# Patient Record
Sex: Female | Born: 1960 | Race: White | Hispanic: No | Marital: Married | Smoking: Former smoker
Health system: Southern US, Community
[De-identification: ages and names within clinical notes are randomized; demographics above are authoritative.]

## PROBLEM LIST (undated history)

## (undated) DIAGNOSIS — E119 Type 2 diabetes mellitus without complications: Secondary | ICD-10-CM

## (undated) DIAGNOSIS — M1990 Unspecified osteoarthritis, unspecified site: Secondary | ICD-10-CM

## (undated) DIAGNOSIS — K2970 Gastritis, unspecified, without bleeding: Secondary | ICD-10-CM

## (undated) DIAGNOSIS — E039 Hypothyroidism, unspecified: Secondary | ICD-10-CM

## (undated) DIAGNOSIS — R232 Flushing: Secondary | ICD-10-CM

## (undated) DIAGNOSIS — F32A Depression, unspecified: Secondary | ICD-10-CM

## (undated) DIAGNOSIS — K449 Diaphragmatic hernia without obstruction or gangrene: Secondary | ICD-10-CM

## (undated) DIAGNOSIS — N281 Cyst of kidney, acquired: Secondary | ICD-10-CM

## (undated) DIAGNOSIS — F329 Major depressive disorder, single episode, unspecified: Secondary | ICD-10-CM

## (undated) DIAGNOSIS — M8580 Other specified disorders of bone density and structure, unspecified site: Secondary | ICD-10-CM

## (undated) DIAGNOSIS — N6019 Diffuse cystic mastopathy of unspecified breast: Secondary | ICD-10-CM

## (undated) DIAGNOSIS — K219 Gastro-esophageal reflux disease without esophagitis: Secondary | ICD-10-CM

## (undated) DIAGNOSIS — Z8619 Personal history of other infectious and parasitic diseases: Secondary | ICD-10-CM

## (undated) HISTORY — PX: TUBAL LIGATION: SHX77

## (undated) HISTORY — DX: Gastritis, unspecified, without bleeding: K29.70

## (undated) HISTORY — DX: Type 2 diabetes mellitus without complications: E11.9

## (undated) HISTORY — DX: Personal history of other infectious and parasitic diseases: Z86.19

## (undated) HISTORY — DX: Diaphragmatic hernia without obstruction or gangrene: K44.9

## (undated) HISTORY — DX: Depression, unspecified: F32.A

## (undated) HISTORY — PX: KIDNEY CYST REMOVAL: SHX684

## (undated) HISTORY — DX: Diffuse cystic mastopathy of unspecified breast: N60.19

## (undated) HISTORY — PX: WISDOM TOOTH EXTRACTION: SHX21

## (undated) HISTORY — DX: Other specified disorders of bone density and structure, unspecified site: M85.80

## (undated) HISTORY — PX: KNEE ARTHROSCOPY: SUR90

## (undated) HISTORY — DX: Unspecified osteoarthritis, unspecified site: M19.90

## (undated) HISTORY — DX: Major depressive disorder, single episode, unspecified: F32.9

---

## 2004-03-27 ENCOUNTER — Ambulatory Visit: Payer: Self-pay | Admitting: Family Medicine

## 2004-08-04 ENCOUNTER — Encounter: Payer: Self-pay | Admitting: Orthopaedic Surgery

## 2004-08-30 ENCOUNTER — Encounter: Payer: Self-pay | Admitting: Orthopaedic Surgery

## 2004-12-05 ENCOUNTER — Ambulatory Visit: Payer: Self-pay | Admitting: Urology

## 2004-12-25 ENCOUNTER — Ambulatory Visit: Payer: Self-pay | Admitting: Urology

## 2005-02-23 ENCOUNTER — Ambulatory Visit: Payer: Self-pay | Admitting: Urology

## 2005-05-05 ENCOUNTER — Ambulatory Visit: Payer: Self-pay | Admitting: Family Medicine

## 2005-05-29 ENCOUNTER — Ambulatory Visit: Payer: Self-pay | Admitting: Urology

## 2006-02-22 ENCOUNTER — Ambulatory Visit: Payer: Self-pay | Admitting: Family Medicine

## 2006-03-30 ENCOUNTER — Ambulatory Visit: Payer: Self-pay | Admitting: Family Medicine

## 2006-05-21 ENCOUNTER — Ambulatory Visit: Payer: Self-pay | Admitting: Urology

## 2007-03-03 ENCOUNTER — Ambulatory Visit: Payer: Self-pay

## 2008-03-28 ENCOUNTER — Ambulatory Visit: Payer: Self-pay | Admitting: Family Medicine

## 2008-07-09 ENCOUNTER — Ambulatory Visit: Payer: Self-pay | Admitting: Unknown Physician Specialty

## 2009-03-29 ENCOUNTER — Ambulatory Visit: Payer: Self-pay

## 2010-04-15 ENCOUNTER — Ambulatory Visit: Payer: Self-pay

## 2010-05-22 ENCOUNTER — Other Ambulatory Visit: Payer: Self-pay | Admitting: Family Medicine

## 2010-05-28 ENCOUNTER — Ambulatory Visit: Payer: Self-pay

## 2010-06-20 ENCOUNTER — Ambulatory Visit: Payer: Self-pay | Admitting: General Practice

## 2011-03-13 ENCOUNTER — Ambulatory Visit: Payer: Self-pay | Admitting: General Practice

## 2011-04-29 ENCOUNTER — Ambulatory Visit: Payer: Self-pay | Admitting: Family Medicine

## 2011-08-25 ENCOUNTER — Ambulatory Visit: Payer: Self-pay | Admitting: Family Medicine

## 2012-06-27 ENCOUNTER — Ambulatory Visit: Payer: Self-pay | Admitting: General Practice

## 2012-11-09 ENCOUNTER — Ambulatory Visit: Payer: Self-pay | Admitting: Family Medicine

## 2013-06-29 ENCOUNTER — Ambulatory Visit: Payer: Self-pay | Admitting: Orthopedic Surgery

## 2013-07-21 NOTE — Progress Notes (Signed)
Please put orders in Epic surgery 08-01-13 pre op visit 07-28-13 Thank you!

## 2013-07-24 NOTE — Progress Notes (Signed)
Need orders in EPIC.  Surgery scheduled for 08/01/13.  Preop on 07/28/13 at 130pm. Thank You.

## 2013-07-25 ENCOUNTER — Other Ambulatory Visit: Payer: Self-pay | Admitting: Orthopedic Surgery

## 2013-07-26 ENCOUNTER — Encounter (HOSPITAL_COMMUNITY): Payer: Self-pay | Admitting: Pharmacy Technician

## 2013-07-27 ENCOUNTER — Encounter (HOSPITAL_COMMUNITY): Payer: Self-pay | Admitting: Pharmacy Technician

## 2013-07-28 ENCOUNTER — Encounter (HOSPITAL_COMMUNITY)
Admission: RE | Admit: 2013-07-28 | Discharge: 2013-07-28 | Disposition: A | Discharge status: Home / Self Care | Payer: 59 | Source: Ambulatory Visit | Attending: Orthopedic Surgery | Admitting: Orthopedic Surgery

## 2013-07-28 ENCOUNTER — Encounter (HOSPITAL_COMMUNITY): Payer: Self-pay

## 2013-07-28 DIAGNOSIS — Z01812 Encounter for preprocedural laboratory examination: Secondary | ICD-10-CM | POA: Insufficient documentation

## 2013-07-28 HISTORY — DX: Gastro-esophageal reflux disease without esophagitis: K21.9

## 2013-07-28 HISTORY — DX: Cyst of kidney, acquired: N28.1

## 2013-07-28 HISTORY — DX: Hypothyroidism, unspecified: E03.9

## 2013-07-28 HISTORY — DX: Flushing: R23.2

## 2013-07-28 NOTE — Patient Instructions (Addendum)
      Your procedure is scheduled on:  08/01/13  TUESDAY  Report to [facility] at  1:00  PM  Call this number if you have problems the morning of surgery: [phone]        Do not eat food After Midnight.MONDAY NIGHT-- MAY HAVE CLEAR LIQUIDS Tuesday MORNING UNTIL 09:30 AM,  THEN NOTHING BY MOUTH   Take these medicines the morning of surgery with A SIP OF WATER: Levothyroxine, Loratadine, Omeprazole, Venlafaxine May take Tramadol if needed   .  Contacts, dentures or partial plates, or metal hairpins  can not be worn to surgery. Your family will be responsible for glasses, dentures, hearing aides while you are in surgery  Leave suitcase in the car. After surgery it may be brought to your room.  For patients admitted to the hospital, checkout time is 11:00 AM day of  discharge.                DO NOT WEAR JEWELRY, LOTIONS, POWDERS, OR PERFUMES.  WOMEN-- DO NOT SHAVE LEGS OR UNDERARMS FOR 48 HOURS BEFORE SHOWERS. MEN MAY SHAVE FACE.  Patients discharged the day of surgery will not be allowed to drive home. IF going home the day of surgery, you must have a driver and someone to stay with you for the first 24 hours  Name and phone number of your driver:    Husband   Gerald Stabs                                                                    Please read over the following fact sheets that you were given: [place]                                                  Patient Signature _____________________________

## 2013-08-01 ENCOUNTER — Ambulatory Visit (HOSPITAL_COMMUNITY)
Admission: RE | Admit: 2013-08-01 | Discharge: 2013-08-01 | Disposition: A | Discharge status: Home / Self Care | Payer: 59 | Source: Ambulatory Visit | Attending: Orthopedic Surgery | Admitting: Orthopedic Surgery

## 2013-08-01 ENCOUNTER — Encounter (HOSPITAL_COMMUNITY): Payer: 59 | Admitting: Anesthesiology

## 2013-08-01 ENCOUNTER — Ambulatory Visit (HOSPITAL_COMMUNITY): Payer: 59 | Admitting: Anesthesiology

## 2013-08-01 ENCOUNTER — Encounter (HOSPITAL_COMMUNITY): Payer: Self-pay | Admitting: *Deleted

## 2013-08-01 ENCOUNTER — Encounter (HOSPITAL_COMMUNITY)
Admission: RE | Disposition: A | Discharge status: Home / Self Care | Payer: Self-pay | Source: Ambulatory Visit | Attending: Orthopedic Surgery

## 2013-08-01 DIAGNOSIS — Q618 Other cystic kidney diseases: Secondary | ICD-10-CM | POA: Insufficient documentation

## 2013-08-01 DIAGNOSIS — Z79899 Other long term (current) drug therapy: Secondary | ICD-10-CM | POA: Insufficient documentation

## 2013-08-01 DIAGNOSIS — Z9851 Tubal ligation status: Secondary | ICD-10-CM | POA: Insufficient documentation

## 2013-08-01 DIAGNOSIS — Z87891 Personal history of nicotine dependence: Secondary | ICD-10-CM | POA: Insufficient documentation

## 2013-08-01 DIAGNOSIS — X58XXXA Exposure to other specified factors, initial encounter: Secondary | ICD-10-CM | POA: Insufficient documentation

## 2013-08-01 DIAGNOSIS — IMO0002 Reserved for concepts with insufficient information to code with codable children: Secondary | ICD-10-CM | POA: Insufficient documentation

## 2013-08-01 DIAGNOSIS — R232 Flushing: Secondary | ICD-10-CM | POA: Insufficient documentation

## 2013-08-01 DIAGNOSIS — K219 Gastro-esophageal reflux disease without esophagitis: Secondary | ICD-10-CM | POA: Insufficient documentation

## 2013-08-01 DIAGNOSIS — M2240 Chondromalacia patellae, unspecified knee: Secondary | ICD-10-CM | POA: Insufficient documentation

## 2013-08-01 DIAGNOSIS — E039 Hypothyroidism, unspecified: Secondary | ICD-10-CM | POA: Insufficient documentation

## 2013-08-01 DIAGNOSIS — S83249A Other tear of medial meniscus, current injury, unspecified knee, initial encounter: Secondary | ICD-10-CM | POA: Diagnosis present

## 2013-08-01 HISTORY — PX: KNEE ARTHROSCOPY: SHX127

## 2013-08-01 SURGERY — ARTHROSCOPY, KNEE
Anesthesia: General | Site: Knee | Laterality: Left

## 2013-08-01 MED ORDER — DEXAMETHASONE SODIUM PHOSPHATE 10 MG/ML IJ SOLN
10.0000 mg | Freq: Once | INTRAMUSCULAR | Status: AC
  Administered 2013-08-01: 10 mg via INTRAVENOUS

## 2013-08-01 MED ORDER — HYDROMORPHONE HCL PF 1 MG/ML IJ SOLN
0.2500 mg | INTRAMUSCULAR | Status: DC | PRN
  Administered 2013-08-01: 0.25 mg via INTRAVENOUS

## 2013-08-01 MED ORDER — MEPERIDINE HCL 50 MG/ML IJ SOLN: 6.2500 mg | INTRAMUSCULAR | Status: DC | PRN

## 2013-08-01 MED ORDER — PROPOFOL 10 MG/ML IV BOLUS
INTRAVENOUS | Status: DC | PRN
  Administered 2013-08-01: 200 mg via INTRAVENOUS

## 2013-08-01 MED ORDER — SODIUM CHLORIDE 0.9 % IV SOLN: INTRAVENOUS | Status: DC

## 2013-08-01 MED ORDER — METHOCARBAMOL 100 MG/ML IJ SOLN
500.0000 mg | Freq: Once | INTRAVENOUS | Status: AC
  Administered 2013-08-01: 500 mg via INTRAVENOUS
  Filled 2013-08-01: qty 5

## 2013-08-01 MED ORDER — SODIUM CHLORIDE 0.9 % IR SOLN
Status: DC | PRN
  Administered 2013-08-01 (×2): 3000 mL

## 2013-08-01 MED ORDER — KETOROLAC TROMETHAMINE 30 MG/ML IJ SOLN
30.0000 mg | Freq: Once | INTRAMUSCULAR | Status: AC
  Administered 2013-08-01: 30 mg via INTRAVENOUS

## 2013-08-01 MED ORDER — ONDANSETRON HCL 4 MG/2ML IJ SOLN
INTRAMUSCULAR | Status: AC
  Filled 2013-08-01: qty 2

## 2013-08-01 MED ORDER — LIDOCAINE HCL (CARDIAC) 20 MG/ML IV SOLN
INTRAVENOUS | Status: AC
  Filled 2013-08-01: qty 5

## 2013-08-01 MED ORDER — HYDROMORPHONE HCL PF 1 MG/ML IJ SOLN
INTRAMUSCULAR | Status: AC
  Filled 2013-08-01: qty 1

## 2013-08-01 MED ORDER — LACTATED RINGERS IV SOLN: INTRAVENOUS | Status: DC

## 2013-08-01 MED ORDER — MIDAZOLAM HCL 5 MG/5ML IJ SOLN
INTRAMUSCULAR | Status: DC | PRN
  Administered 2013-08-01: 2 mg via INTRAVENOUS

## 2013-08-01 MED ORDER — FENTANYL CITRATE 0.05 MG/ML IJ SOLN
INTRAMUSCULAR | Status: AC
  Filled 2013-08-01: qty 5

## 2013-08-01 MED ORDER — KETOROLAC TROMETHAMINE 30 MG/ML IJ SOLN
INTRAMUSCULAR | Status: AC
  Filled 2013-08-01: qty 1

## 2013-08-01 MED ORDER — CEFAZOLIN SODIUM-DEXTROSE 2-3 GM-% IV SOLR
2.0000 g | INTRAVENOUS | Status: AC
  Administered 2013-08-01: 2 g via INTRAVENOUS

## 2013-08-01 MED ORDER — LIDOCAINE HCL (PF) 2 % IJ SOLN
INTRAMUSCULAR | Status: DC | PRN
  Administered 2013-08-01: 75 mg via INTRADERMAL

## 2013-08-01 MED ORDER — MIDAZOLAM HCL 2 MG/2ML IJ SOLN
INTRAMUSCULAR | Status: AC
  Filled 2013-08-01: qty 2

## 2013-08-01 MED ORDER — STERILE WATER FOR INJECTION IJ SOLN
INTRAMUSCULAR | Status: AC
Start: 2013-08-01 — End: 2013-08-01
  Filled 2013-08-01: qty 10

## 2013-08-01 MED ORDER — ACETAMINOPHEN 10 MG/ML IV SOLN
1000.0000 mg | Freq: Once | INTRAVENOUS | Status: AC
  Administered 2013-08-01: 1000 mg via INTRAVENOUS
  Filled 2013-08-01: qty 100

## 2013-08-01 MED ORDER — METHOCARBAMOL 500 MG PO TABS: 500.0000 mg | ORAL_TABLET | Freq: Four times a day (QID) | ORAL | Status: DC

## 2013-08-01 MED ORDER — OXYCODONE HCL 5 MG/5ML PO SOLN
5.0000 mg | Freq: Once | ORAL | Status: DC | PRN
  Filled 2013-08-01: qty 5

## 2013-08-01 MED ORDER — PROPOFOL 10 MG/ML IV BOLUS
INTRAVENOUS | Status: AC
  Filled 2013-08-01: qty 20

## 2013-08-01 MED ORDER — BUPIVACAINE-EPINEPHRINE PF 0.25-1:200000 % IJ SOLN
INTRAMUSCULAR | Status: AC
  Filled 2013-08-01: qty 30

## 2013-08-01 MED ORDER — ONDANSETRON HCL 4 MG/2ML IJ SOLN
INTRAMUSCULAR | Status: DC | PRN
  Administered 2013-08-01: 4 mg via INTRAVENOUS

## 2013-08-01 MED ORDER — FENTANYL CITRATE 0.05 MG/ML IJ SOLN
INTRAMUSCULAR | Status: DC | PRN
  Administered 2013-08-01 (×3): 50 ug via INTRAVENOUS

## 2013-08-01 MED ORDER — BUPIVACAINE-EPINEPHRINE 0.25% -1:200000 IJ SOLN
INTRAMUSCULAR | Status: DC | PRN
Start: 2013-08-01 — End: 2013-08-01
  Administered 2013-08-01: 30 mL

## 2013-08-01 MED ORDER — CEFAZOLIN SODIUM-DEXTROSE 2-3 GM-% IV SOLR
INTRAVENOUS | Status: AC
  Filled 2013-08-01: qty 50

## 2013-08-01 MED ORDER — PROMETHAZINE HCL 25 MG/ML IJ SOLN: 6.2500 mg | INTRAMUSCULAR | Status: DC | PRN

## 2013-08-01 MED ORDER — HYDROCODONE-ACETAMINOPHEN 5-325 MG PO TABS: 1.0000 | ORAL_TABLET | Freq: Four times a day (QID) | ORAL | Status: DC | PRN

## 2013-08-01 MED ORDER — LACTATED RINGERS IV SOLN
INTRAVENOUS | Status: DC | PRN
  Administered 2013-08-01: 17:00:00 via INTRAVENOUS

## 2013-08-01 MED ORDER — OXYCODONE HCL 5 MG PO TABS: 5.0000 mg | ORAL_TABLET | Freq: Once | ORAL | Status: DC | PRN

## 2013-08-01 SURGICAL SUPPLY — 27 items
BANDAGE ELASTIC 6 VELCRO ST LF (GAUZE/BANDAGES/DRESSINGS) ×2 IMPLANT
BLADE 4.2CUDA (BLADE) ×2 IMPLANT
CLOTH BEACON ORANGE TIMEOUT ST (SAFETY) ×2 IMPLANT
COUNTER NEEDLE 20 DBL MAG RED (NEEDLE) ×2 IMPLANT
CUFF TOURN SGL QUICK 34 (TOURNIQUET CUFF) ×1
CUFF TRNQT CYL 34X4X40X1 (TOURNIQUET CUFF) ×1 IMPLANT
DRAPE U-SHAPE 47X51 STRL (DRAPES) ×2 IMPLANT
DRSG EMULSION OIL 3X3 NADH (GAUZE/BANDAGES/DRESSINGS) ×2 IMPLANT
DURAPREP 26ML APPLICATOR (WOUND CARE) ×2 IMPLANT
GLOVE BIO SURGEON STRL SZ8 (GLOVE) ×2 IMPLANT
GLOVE BIOGEL PI IND STRL 8 (GLOVE) ×1 IMPLANT
GLOVE BIOGEL PI INDICATOR 8 (GLOVE) ×1
GOWN STRL REUS W/TWL LRG LVL3 (GOWN DISPOSABLE) ×2 IMPLANT
MANIFOLD NEPTUNE II (INSTRUMENTS) ×2 IMPLANT
PACK ARTHROSCOPY WL (CUSTOM PROCEDURE TRAY) ×2 IMPLANT
PACK ICE MAXI GEL EZY WRAP (MISCELLANEOUS) ×6 IMPLANT
PADDING CAST ABS 6INX4YD NS (CAST SUPPLIES) ×1
PADDING CAST ABS COTTON 6X4 NS (CAST SUPPLIES) ×1 IMPLANT
PADDING CAST COTTON 6X4 STRL (CAST SUPPLIES) ×4 IMPLANT
POSITIONER SURGICAL ARM (MISCELLANEOUS) IMPLANT
SET ARTHROSCOPY TUBING (MISCELLANEOUS) ×1
SET ARTHROSCOPY TUBING LN (MISCELLANEOUS) ×1 IMPLANT
SPONGE GAUZE 4X4 12PLY (GAUZE/BANDAGES/DRESSINGS) ×2 IMPLANT
SUT ETHILON 4 0 PS 2 18 (SUTURE) ×2 IMPLANT
TOWEL OR 17X26 10 PK STRL BLUE (TOWEL DISPOSABLE) ×2 IMPLANT
WAND 90 DEG TURBOVAC W/CORD (SURGICAL WAND) ×2 IMPLANT
WRAP KNEE MAXI GEL POST OP (GAUZE/BANDAGES/DRESSINGS) ×2 IMPLANT

## 2013-08-01 NOTE — Transfer of Care (Signed)
Immediate Anesthesia Transfer of Care Note  Patient: Angela Davenport  Procedure(s) Performed: Procedure(s): ARTHROSCOPY LEFT KNEE WITH MEDIAL MENISCUS DEBRIDEMENT AND CHONDRPLASTY (Left)  Patient Location: PACU  Anesthesia Type:General  Level of Consciousness: awake, alert  and oriented  Airway & Oxygen Therapy: Patient Spontanous Breathing and Patient connected to face mask oxygen  Post-op Assessment: Report given to PACU RN and Post -op Vital signs reviewed and stable  Post vital signs: Reviewed and stable  Complications: No apparent anesthesia complications

## 2013-08-01 NOTE — Anesthesia Postprocedure Evaluation (Signed)
Anesthesia Post Note  Patient: Angela Davenport  Procedure(s) Performed: Procedure(s) (LRB): ARTHROSCOPY LEFT KNEE WITH MEDIAL MENISCUS DEBRIDEMENT AND CHONDRPLASTY (Left)  Anesthesia type: General  Patient location: PACU  Post pain: Pain level controlled  Post assessment: Post-op Vital signs reviewed  Last Vitals: BP 138/77  Pulse 95  Temp(Src) 36.7 C (Oral)  Resp 12  SpO2 98%  Post vital signs: Reviewed  Level of consciousness: sedated  Complications: No apparent anesthesia complications

## 2013-08-01 NOTE — Interval H&P Note (Signed)
History and Physical Interval Note:  08/01/2013 4:57 PM  Angela Davenport  has presented today for surgery, with the diagnosis of left knee medial meniscal tear  The various methods of treatment have been discussed with the patient and family. After consideration of risks, benefits and other options for treatment, the patient has consented to  Procedure(s): ARTHROSCOPY LEFT KNEE WITH DEBRIDEMENT (Left) as a surgical intervention .  The patient's history has been reviewed, patient examined, no change in status, stable for surgery.  I have reviewed the patient's chart and labs.  Questions were answered to the patient's satisfaction.     Gearlean Alf

## 2013-08-01 NOTE — Progress Notes (Signed)
PACU Nursing Note: DC instruction note: Pt ready for DC from PACU to home per MD orders, pt alert and oriented x 4, MAE x 4, able to ambulate with min assistance, pain under control, no complaints of N&V. Tolerating PO fluids. DC instructions reviewed with patient and significant other, via teach back method and with written dc instructions as outlined by MD. Pt teaching done in regards (1) safety while taking pain medication (2) post op diet and activity (3) importance of keeping follow up MD appointment (4) Dsg care with importance of washing hands (5) reviewed Rx's written by MD, which both were given to husband. (6) when to call MD i.e. S&S of infection, pain not relieved, CMS issues etc. Copy of DC instructions provided to patient and husband. Several opportunities for questions provided during discharge teaching. Pt escorted to private vehicle with PACU RN and husband at side, assisted into private vehicle. Again opportunity for questions provided prior to final departure from hospital  Guerry Bruin, RN, BSN, [place], CCRN

## 2013-08-01 NOTE — H&P (Signed)
  CC- Angela Davenport is a 53 y.o. female who presents with left knee pain.  HPI- . Knee Pain: Patient presents with knee pain involving the  left knee. Onset of the symptoms was several months ago. Inciting event: none known. Current symptoms include giving out, pain located medially, stiffness and swelling. Pain is aggravated by lateral movements, pivoting, rising after sitting, squatting, standing and walking.  Patient has had no prior knee problems. Evaluation to date: MRI: abnormal medial meniscal tear. Treatment to date: rest.  Past Medical History  Diagnosis Date  . Hypothyroidism   . Kidney cysts   . GERD (gastroesophageal reflux disease)   . Hot flashes     history of- states controlled with hormonal gel and Effexor    Past Surgical History  Procedure Laterality Date  . Tubal ligation    . Wisdom tooth extraction    . Knee arthroscopy Left   . Kidney cyst removal Left     IV sedation    Prior to Admission medications   Medication Sig Start Date End Date Taking? Authorizing Provider  levothyroxine (SYNTHROID, LEVOTHROID) 137 MCG tablet Take 137 mcg by mouth daily before breakfast.   Yes Historical Provider, MD  loratadine (CLARITIN) 10 MG tablet Take 10 mg by mouth daily.   Yes Historical Provider, MD  omeprazole (PRILOSEC) 20 MG capsule Take 20 mg by mouth daily.   Yes Historical Provider, MD  PRESCRIPTION MEDICATION Apply 1 application topically daily. TESTOSTERONE-ESTROGEN-PROGESTERONE CREAM APPLY TO INNER ARM DAILY   Yes Historical Provider, MD  traMADol (ULTRAM) 50 MG tablet Take 50-100 mg by mouth every 6 (six) hours as needed for moderate pain or severe pain.   Yes Historical Provider, MD  venlafaxine XR (EFFEXOR-XR) 75 MG 24 hr capsule Take 75 mg by mouth daily with breakfast.   Yes Historical Provider, MD  naproxen sodium (ANAPROX) 220 MG tablet Take 220-440 mg by mouth 2 (two) times daily with a meal.    Historical Provider, MD   KNEE EXAM antalgic gait, soft tissue  tenderness over medial joint line, no effusion, negative pivot-shift, collateral ligaments intact  Physical Examination: General appearance - alert, well appearing, and in no distress Mental status - alert, oriented to person, place, and time Chest - clear to auscultation, no wheezes, rales or rhonchi, symmetric air entry Heart - normal rate, regular rhythm, normal S1, S2, no murmurs, rubs, clicks or gallops Abdomen - soft, nontender, nondistended, no masses or organomegaly Neurological - alert, oriented, normal speech, no focal findings or movement disorder noted    Asessment/Plan--- Left knee medial meniscal tear- - Plan left knee arthroscopy with meniscal debridement. Procedure risks and potential comps discussed with patient who elects to proceed. Goals are decreased pain and increased function with a high likelihood of achieving both

## 2013-08-01 NOTE — Op Note (Signed)
Preoperative diagnosis-  Left knee medial meniscal tear  Postoperative diagnosis Left- knee medial meniscal tear plus Left medial femoral chondral defect  Procedure- Left knee arthroscopy with medial meniscal debridement plus chondral defect   Surgeon- Dione Plover. Angela Wittke, MD  Anesthesia-General  EBL-  minimal Complications- None  Condition- PACU - hemodynamically stable.  Brief clinical note- -Angela Davenport is a 53 y.o.  female with a several month history of left knee pain and mechanical symptoms. Exam and history suggested medial meniscal tear confirmed by MRI. The patient presents now for arthroscopy and debridement   Procedure in detail -       After successful administration of General anesthetic, a tourmiquet is placed high on the Left  thigh and the Left lower extremity is prepped and draped in the usual sterile fashion. Time out is performed by the surgical team. Standard superomedial and inferolateral portal sites are marked and incisions made with an 11 blade. The inflow cannula is passed through the superomedial portal and camera through the inferolateral portal and inflow is initiated. Arthroscopic visualization proceeds.      The undersurface of the patella and trochlea are visualized and they are normal. The medial and lateral gutters are visualized and there are  no loose bodies. Flexion and valgus force is applied to the knee and the medial compartment is entered. A spinal needle is passed into the joint through the site marked for the inferomedial portal. A small incision is made and the dilator passed into the joint. The findings for the medial compartment are degenerative unstable tear of the medial meniscus with a large 2 x 3 cm area of unstable cartilage and exposed bone medial femoral condyle . The tear is debrided to a stable base with baskets and a shaver and sealed off with the Arthrocare. The shaver is used to debride the unstable cartilage to a stable bony base with  stable edges and the bone is abraded with the shaver. The meniscus is probed and found to be stable.    The intercondylar notch is visualized and the ACL appears normal. The lateral compartment is entered and the findings are normal .       The joint is again inspected and there are no other tears, defects or loose bodies identified. The arthroscopic equipment is then removed from the inferior portals which are closed with interrupted 4-0 nylon. 20 ml of .25% Marcaine with epinephrine are injected through the inflow cannula and the cannula is then removed and the portal closed with nylon. The incisions are cleaned and dried and a bulky sterile dressing is applied. The patient is then awakened and transported to recovery in stable condition.   08/01/2013, 6:12 PM

## 2013-08-01 NOTE — Progress Notes (Signed)
Patient informed that surgery is running late . She is upset.  Emotional support given.

## 2013-08-01 NOTE — Discharge Instructions (Signed)

## 2013-08-01 NOTE — Anesthesia Preprocedure Evaluation (Addendum)
Anesthesia Evaluation  Patient identified by MRN, date of birth, ID band Patient awake    Reviewed: Allergy & Precautions, H&P , NPO status , Patient's Chart, lab work & pertinent test results  Airway Mallampati: II TM Distance: >3 FB Neck ROM: Full    Dental  (+) Dental Advisory Given, Teeth Intact   Pulmonary former smoker,  breath sounds clear to auscultation        Cardiovascular Rhythm:Regular Rate:Normal     Neuro/Psych negative neurological ROS  negative psych ROS   GI/Hepatic Neg liver ROS, GERD-  Medicated,  Endo/Other  negative endocrine ROSHypothyroidism   Renal/GU Renal disease     Musculoskeletal negative musculoskeletal ROS (+)   Abdominal   Peds  Hematology negative hematology ROS (+)   Anesthesia Other Findings   Reproductive/Obstetrics negative OB ROS                          Anesthesia Physical Anesthesia Plan  ASA: II  Anesthesia Plan: General   Post-op Pain Management:    Induction: Intravenous  Airway Management Planned: LMA  Additional Equipment:   Intra-op Plan:   Post-operative Plan: Extubation in OR  Informed Consent: I have reviewed the patients History and Physical, chart, labs and discussed the procedure including the risks, benefits and alternatives for the proposed anesthesia with the patient or authorized representative who has indicated his/her understanding and acceptance.   Dental advisory given  Plan Discussed with: CRNA  Anesthesia Plan Comments:         Anesthesia Quick Evaluation

## 2013-08-02 ENCOUNTER — Encounter (HOSPITAL_COMMUNITY): Payer: Self-pay | Admitting: Orthopedic Surgery

## 2014-01-12 ENCOUNTER — Ambulatory Visit: Payer: Self-pay | Admitting: Family Medicine

## 2014-01-30 ENCOUNTER — Ambulatory Visit: Payer: Self-pay | Admitting: Family Medicine

## 2014-03-01 ENCOUNTER — Ambulatory Visit: Payer: Self-pay | Admitting: Family Medicine

## 2014-03-23 ENCOUNTER — Ambulatory Visit: Payer: Self-pay | Admitting: Family Medicine

## 2014-08-06 DIAGNOSIS — F32A Depression, unspecified: Secondary | ICD-10-CM | POA: Insufficient documentation

## 2014-08-06 DIAGNOSIS — E039 Hypothyroidism, unspecified: Secondary | ICD-10-CM | POA: Insufficient documentation

## 2014-08-06 DIAGNOSIS — E119 Type 2 diabetes mellitus without complications: Secondary | ICD-10-CM | POA: Insufficient documentation

## 2014-08-06 DIAGNOSIS — F329 Major depressive disorder, single episode, unspecified: Secondary | ICD-10-CM | POA: Insufficient documentation

## 2014-08-08 ENCOUNTER — Ambulatory Visit: Payer: Self-pay | Admitting: Surgery

## 2014-08-14 ENCOUNTER — Ambulatory Visit: Payer: Self-pay | Admitting: Surgery

## 2014-08-14 HISTORY — PX: MEDIAL PARTIAL KNEE REPLACEMENT: SHX5965

## 2014-09-04 ENCOUNTER — Encounter
Admit: 2014-09-04 | Disposition: A | Discharge status: Home / Self Care | Payer: Self-pay | Attending: Surgery | Admitting: Surgery

## 2014-09-10 ENCOUNTER — Ambulatory Visit: Payer: 59

## 2014-09-12 ENCOUNTER — Other Ambulatory Visit: Payer: Self-pay

## 2014-09-12 NOTE — Patient Outreach (Signed)
[facility]) Care Management  [place]  09/12/2014   ZIARE ORRICK 1960-11-10 [id number]  Subjective: Patient reports she has had a left partial knee replacement approximately 4 weeks ago.  Says she feels great- denies pain other than when she exercises (3/10). Has seen MD post surgically. Denies problems with blood sugars- reports that the fasting blood sugars have gone from 160mg /dl before she was exercising regularly to now 120mg /dl which they have remained, even around surgery.    She reports having little appetite since surgery.  Objective: Surgical site is well approximated; Saide is wearing support hose.  She did not bring her blood sugar report or meter. Lab results much improved since implementing new diet and exercise strategies.   Current Medications:  Current Outpatient Prescriptions  Medication Sig Dispense Refill  . aspirin EC 81 MG tablet Take 81 mg by mouth daily.    Marland Kitchen levothyroxine (SYNTHROID, LEVOTHROID) 125 MCG tablet Take 125 mcg by mouth daily before breakfast.    . PRESCRIPTION MEDICATION Apply 1 application topically daily. TESTOSTERONE-ESTROGEN-PROGESTERONE CREAM APPLY TO INNER ARM DAILY    . venlafaxine XR (EFFEXOR-XR) 150 MG 24 hr capsule Take 150 mg by mouth daily with breakfast.    . HYDROcodone-acetaminophen (NORCO) 5-325 MG per tablet Take 1-2 tablets by mouth every 6 (six) hours as needed for moderate pain. (Patient not taking: Reported on 09/12/2014) 30 tablet 0  . levothyroxine (SYNTHROID, LEVOTHROID) 137 MCG tablet Take 137 mcg by mouth daily before breakfast.    . loratadine (CLARITIN) 10 MG tablet Take 10 mg by mouth daily.    . methocarbamol (ROBAXIN) 500 MG tablet Take 1 tablet (500 mg total) by mouth 4 (four) times daily. As needed for muscle spasm (Patient not taking: Reported on 09/12/2014) 30 tablet 1  . naproxen sodium (ANAPROX) 220 MG tablet Take 220-440 mg by mouth 2 (two) times daily with a meal.    . omeprazole  (PRILOSEC) 20 MG capsule Take 20 mg by mouth daily.    . traMADol (ULTRAM) 50 MG tablet Take 50-100 mg by mouth every 6 (six) hours as needed for moderate pain or severe pain.    Marland Kitchen venlafaxine XR (EFFEXOR-XR) 75 MG 24 hr capsule Take 75 mg by mouth daily with breakfast.     No current facility-administered medications for this visit.     Fall/Depression Screening: PHQ 2/9 Scores 09/12/2014  PHQ - 2 Score 0  Exception Documentation (No Data)    Assessment/ Plan: Patient is doing very well managing her blood sugars, eating well to manage weight and exercise as she is able to.  She currently weighs 172.1 lbs, down from 176.5 on 03/05/14.  Her goal is to get down to 150lbs and with her current plan of action, she's likely to attain this.  Valerya will continue to go to PT and do home visits- progressing each week to get to pre-surgical exercise habits. Reminded to eat 3 meals per day (she sometimes skips lunch- has been instructed to eat at least a snack during lunch time).     [place]        Patient Outreach from 09/12/2014 in [place] Problem One  Lack of exercise stamina as a result of recent surgery   Care Plan for Problem One  Active   Interventions for Problem One Long Term Goal  Continue with PT 2x/week.  Continue to do home exercises PT has prescribed for  you.  Progressively add minutes of exercise each week until you reach your pre-surgery exercise schedule.     [facility] Long Term Goal (31-90 days)  Patient will be able to exercise in the gym for 30-45 minutes 3 times per week   [facility] Long Term Goal Start Date  09/12/14     [name], RN, [place], [place], CDE Diabetes Coordinator Inpatient Diabetes Program  [phone] (Team Pager) [phone] [facility]) 09/12/2014 12:02 PM

## 2014-09-30 NOTE — Op Note (Signed)
PATIENT NAME:  Angela Davenport, Angela Davenport MR#:  [id number] DATE OF BIRTH:  1961/04/03  DATE OF PROCEDURE:  08/14/2014  PREOPERATIVE DIAGNOSIS: Degenerative joint disease, left knee.   POSTOPERATIVE DIAGNOSIS: Degenerative joint disease, left knee.   PROCEDURE: Left unicondylar knee arthroplasty using an all-cemented Biomet Oxford system with a small-sized femoral component, a "B" sized tibial tray, and a 4 mm meniscal bearing insert.   SURGEON: Pascal Lux, MD   ASSISTANT: Rickard Patience, PA-S  ANESTHESIA: Spinal.   FINDINGS: As noted above.   COMPLICATIONS: None.   ESTIMATED BLOOD LOSS: Less than 25 mL.  TOTAL FLUIDS: 1000 mL of crystalloid.   URINE OUTPUT: 300 mL.  TOURNIQUET TIME: 80 minutes at 300 mmHg.   DRAINS: None.   CLOSURE: Staples.   BRIEF CLINICAL NOTE: The patient is a 54 year old female with a several year history of gradually worsening medial sided left knee pain. Her symptoms have progressed despite medications, activity modification, etc. Her history and examination are consistent with degenerative joint disease, primarily involving the medial compartment. She presents at this time for a left partial knee replacement.   DESCRIPTION OF PROCEDURE: The patient was brought into the operating room. After adequate spinal anesthesia was obtained, she was lain in the supine position and a Foley catheter inserted. The patient was repositioned so that her right leg was placed in a flexed and abducted position in the yellowfin leg holder while the left leg was placed over a Biomet leg holder and the foot of the bed flexed down and out of the way. The left lower extremity was prepped with ChloraPrep solution before being draped sterilely. Preoperative antibiotics were administered. The limb was exsanguinated with an Esmarch and the tourniquet inflated to 300 mmHg. An approximately 3 to 3-1/2 inch incision was made over the anterior aspect of the knee beginning at the midportion of  the patella and extending to the tibial tubercle. The incision was carried down through the subcutaneous tissues to expose the superficial retinaculum. This was split the length of the incision and the medial flap elevated sufficiently to expose the medial edge of the patellar tendon. This was incised and carried proximally along the medial side of the patella, leaving a 3 to 4 mm cuff of tissue. A subtotal fat pad excision was performed in order to improve visualization. The anterior portion of the medial meniscus was removed and the soft tissues elevated off the anteromedial aspect of the proximal tibia to the level of the collateral ligament. The notch was inspected. Several osteophytes were removed. The ACL itself was intact. The lateral compartment appeared to be in good condition. The tibial guide was positioned and, after sizing the femur and leaving the small size spoon in place, the 2 devices were connected using the 3 mm coupler. The guide was positioned and the appropriate proximal cut made using the reciprocating and oscillating saws. An attempt was made to pass the 9 mm spacer, but this was too tight. Therefore, an extra 2 mm was removed. Subsequently, the 9 mm spacer could easily be inserted between the femur and the tibia, indicating that sufficient bone had been removed. The piece of bone that was removed was measured on the back table and found to be optimally replicated by a "B" sized tibial component.   Attention was directed to the distal femur. The intramedullary canal was accessed through a 4 mm drill hole. The intramedullary guide was positioned. The femoral drill hole guide was positioned and the appropriate coupler positioned  to connect the guide with the intramedullary guide. The two drill holes were drilled into the distal femur before the guide system was removed. The posterior condylar cutting block was positioned and the posterior condylar cut made. The #0 spigot was inserted and the  initial bone milling performed. The trial femoral component was inserted and the flexion and extension gaps measured. The flexion gap measured 7 mm and the extension gap measured 4 mm; therefore, an additional 3 mm was removed using the #3 spigot and performing a secondary bone milling. Subsequent trialing demonstrated symmetric flexion and extension gaps. Of note, excess bone was removed from the posteromedial and posterolateral corners of the femoral condyle as well as anteriorly and from beneath the collar prior to the secondary trialing. At this point, the posterior portion of the medial meniscus also was removed. The drill holes were drilled into the distal femur to augment cement fixation.   Attention was redirected to the tibial side. The "B" sized trial tray was positioned and temporarily secured using a headed pin. The keel was created using the appropriate bi-bladed reciprocating saw and hoe. The keeled trial component was then inserted to be sure that it fit properly.   The wound was copiously irrigated with bacitracin saline solution via the jet lavage system before being packed with a dry lap sponge. Prior to irrigation, a total of 20 mL of Exparel diluted out to 60 mL with normal saline was injected into the posterior capsule, the medial gutter and capsular tissues, and the peri-incisional tissues to help with postoperative analgesia. In addition, 30 mL of 0.25% Sensorcaine with epi also was injected into these areas. Meanwhile, cement was being mixed on the back table. When the cement was ready, the tibial tray was cemented in first. The excess cement was removed using a Surveyor, quantity. Next, the femoral component was impacted into place. Again, the excess cement was removed using a Surveyor, quantity. The trial 5 mm spacer was inserted and the knee brought out to near full extension while the cement hardened. Once the cement had hardened, the 4 mm meniscal bearing trial was positioned and found  to fit well. Therefore, the permanent 4 mm meniscal bearing insert was selected and inserted. The knee was placed through a range of motion, demonstrated excellent tracking without evidence toward subluxation or dislocation.   The wound was copiously irrigated with bacitracin saline solution via the jet lavage system before the retinacular layer was reapproximated using 2-0 Vicryl interrupted sutures. The superficial retinacular layer also was closed using 2-0 Vicryl interrupted sutures before the skin was closed using staples. A sterile occlusive dressing was applied to the knee before an Ace wrap was applied to the knee to accommodate a Polar Care pack. The patient was then awakened and returned to the recovery room in satisfactory condition after tolerating the procedure well.   ____________________________ J. Dorien Chihuahua, MD jjp:sb D: 08/14/2014 10:18:36 ET T: 08/14/2014 10:30:27 ET JOB#: [id number]  cc: Pascal Lux, MD, <Dictator> Pascal Lux MD ELECTRONICALLY SIGNED 08/14/2014 15:09

## 2014-12-17 ENCOUNTER — Ambulatory Visit: Payer: 59

## 2014-12-17 NOTE — Patient Outreach (Signed)
[facility]) Care Management  12/17/2014  [name] 17-Jan-1961 [id number]   Angela Davenport called to cancel today's appointment but sent an update- She saw Dr. Lovie Macadamia on July 15th for her annual visit. Her A1C was 6.6% and they have decided she should begin Metformin 500mg  ER once a day.   She is consistently checking blood sugars and they are 135-160mg  fasting and 90-130mg /dl 2 hr after a meal.   She will follow up with MD in 3 months- they will discuss statins and an ace inhibitor at that time.   She will follow up with me in 3 weeks.   [name], RN, BA, [place], [place] Dial:  [phone]  Fax:  [phone] E-mail: [email or web address] [address], [place], [place]  [address]

## 2014-12-18 ENCOUNTER — Other Ambulatory Visit: Payer: Self-pay

## 2014-12-18 NOTE — Patient Outreach (Signed)
[facility]) Care Management  12/18/2014  Angela Davenport 26-Nov-1960 [id number]   Left a message on [name]'s phone telling her I got her message regarding cancelling yesterday's visit, her recent visit with MD, and her addition of diabetes medication- I will have her follow up with me by phone in 3 weeks.    [name], RN, BA, [place], [place] Direct Dial:  [phone]  Fax:  [phone] E-mail: [name] [address] [place], [place]  [address]

## 2015-01-07 ENCOUNTER — Other Ambulatory Visit: Payer: Self-pay

## 2015-01-07 NOTE — Patient Outreach (Signed)
[facility]) Care Management  01/07/2015  Angela Davenport 04-23-1961 [id number]   Left a message at [place] home phone number- asked her to update me on how her new medication (Metformin) is working on elevated fasting blood sugar numbers.    [name], RN, BA, [place], [place] Direct Dial:  [phone]  Fax:  [phone] E-mail: [email or web address] [address] [place], [place]  [address]

## 2015-01-10 ENCOUNTER — Other Ambulatory Visit: Payer: Self-pay

## 2015-01-10 NOTE — Patient Outreach (Signed)
[facility]) Care Management  01/10/2015  Angela Davenport 03-11-1961 [id number]   Received an e-mail from [name] regarding recent start of Glucophage.   Date   Fasting    2 hours PP lunch    supper 7/19   149     97 & 155 7/20   133     86 7/21   152     82 &181(1 glass of wine) 7/22   149 7/23   128     117 7/24   130 7/25   123  7/26   151     103 7/27   139 7/28   152     158 7/29   107     107 7/30   120     140 & 165 7/31   119     135 8/1   122     126 8/2   126     148 8/3   140 8/4   136 8/6    160      184(1 beer) 8/7    128 8/8    135      135 8/9    120      174 (1 beer) 8/10    136      149 01/10/15   117  Started Metformin ER 500 mg at 8-9 pm on 7/19 Increased MEtformin ER to 1000 mg on 7/26.   [name], RN, BA, [place], [place] Direct Dial:  [phone]  Fax:  [phone] E-mail: [name] [address] [place], [place]  [address]

## 2015-01-10 NOTE — Patient Outreach (Addendum)
[facility]) Care Management  01/10/2015  BELANNA MANRING 1960-11-07 [id number]  Spoke with patient by phone- 2hr post prandial blood glucose in early July ideal and 2 hr post prandial blood sugars (supper) OK unless she drinks alcohol.  Recommended she continue Metformin as ordered- fasting blood sugars improved- limit supper to 2 carbs when she drinks alcohol- always eat protein and fill up on "free vegetables".  Limit alcohol to 1/meal.  F/U with MD in the fall and [facility] in 3 months.  Patient tells me her goal is to get her A1C under 6%- recommended she aim for A1C 6.5-7% as recommended by the American Diabetes Association and American Endocrinologists.   [name], RN, BA, [place], [place] Direct Dial:  [phone]  Fax:  [phone] E-mail: [email or web address] [address] [place], [place]  [address]

## 2015-05-01 ENCOUNTER — Ambulatory Visit: Payer: 59 | Attending: Neurology

## 2015-05-01 DIAGNOSIS — G4733 Obstructive sleep apnea (adult) (pediatric): Secondary | ICD-10-CM | POA: Diagnosis not present

## 2015-06-14 DIAGNOSIS — E119 Type 2 diabetes mellitus without complications: Secondary | ICD-10-CM | POA: Diagnosis not present

## 2015-06-14 DIAGNOSIS — E538 Deficiency of other specified B group vitamins: Secondary | ICD-10-CM | POA: Diagnosis not present

## 2015-06-14 DIAGNOSIS — F329 Major depressive disorder, single episode, unspecified: Secondary | ICD-10-CM | POA: Diagnosis not present

## 2015-06-14 DIAGNOSIS — E039 Hypothyroidism, unspecified: Secondary | ICD-10-CM | POA: Diagnosis not present

## 2015-06-20 ENCOUNTER — Telehealth: Payer: Self-pay

## 2015-07-17 ENCOUNTER — Encounter: Payer: Self-pay | Admitting: Pharmacist

## 2015-07-17 ENCOUNTER — Other Ambulatory Visit: Payer: Self-pay | Admitting: Pharmacist

## 2015-07-17 NOTE — Patient Outreach (Signed)
[facility]) Care Management  [place]   07/17/2015  Angela Davenport 23-Aug-1960 [id number]  Subjective:  Angela Davenport is a 55 year old female here today for the [facility] visit with the pharmacist. She is feeling very positive about being able to exercise status post knee surgery last year.  She is up to date with her dental exam and will make an appointment for her annual eye exam.  Objective:  A1C: 6.9% on 06/14/15 Lipid Panel: TC = 225 mg/dl, TG = 161 mg/dl, HDL = 49 mg/dl, LDL = 143 mg/dl on 03/14/15   Current Medications: Current Outpatient Prescriptions  Medication Sig Dispense Refill  . levothyroxine (SYNTHROID, LEVOTHROID) 137 MCG tablet Take 137 mcg by mouth daily before breakfast.    . loratadine (CLARITIN) 10 MG tablet Take 10 mg by mouth daily.    . metFORMIN (GLUCOPHAGE-XR) 500 MG 24 hr tablet Take 1,000 mg by mouth every evening. 1 tablet at bedtime; increase to 2 tablets as needed    . PRESCRIPTION MEDICATION Apply 1 application topically daily. TESTOSTERONE-ESTROGEN-PROGESTERONE CREAM APPLY TO INNER ARM DAILY    . venlafaxine XR (EFFEXOR-XR) 150 MG 24 hr capsule Take 150 mg by mouth daily with breakfast.    . aspirin EC 81 MG tablet Take 81 mg by mouth daily. Reported on 07/17/2015    . HYDROcodone-acetaminophen (NORCO) 5-325 MG per tablet Take 1-2 tablets by mouth every 6 (six) hours as needed for moderate pain. (Patient not taking: Reported on 09/12/2014) 30 tablet 0  . levothyroxine (SYNTHROID, LEVOTHROID) 125 MCG tablet Take 125 mcg by mouth daily before breakfast. Reported on 07/17/2015    . methocarbamol (ROBAXIN) 500 MG tablet Take 1 tablet (500 mg total) by mouth 4 (four) times daily. As needed for muscle spasm (Patient not taking: Reported on 09/12/2014) 30 tablet 1  . naproxen sodium (ANAPROX) 220 MG tablet Take 220-440 mg by mouth 2 (two) times daily with a meal. Reported on 07/17/2015    . omeprazole (PRILOSEC) 20 MG capsule Take 20  mg by mouth daily. Reported on 07/17/2015    . traMADol (ULTRAM) 50 MG tablet Take 50-100 mg by mouth every 6 (six) hours as needed for moderate pain or severe pain. Reported on 07/17/2015    . venlafaxine XR (EFFEXOR-XR) 75 MG 24 hr capsule Take 75 mg by mouth daily with breakfast. Reported on 07/17/2015     No current facility-administered medications for this visit.    Functional Status: In your present state of health, do you have any difficulty performing the following activities: 07/17/2015  Hearing? Y  Vision? N  Difficulty concentrating or making decisions? N  Walking or climbing stairs? N  Dressing or bathing? N  Doing errands, shopping? N    Fall/Depression Screening: PHQ 2/9 Scores 07/17/2015 09/12/2014  PHQ - 2 Score 0 0  Exception Documentation - (No Data)    [facility] CM Care Plan Problem One        Most Recent Value   Care Plan Problem One  Continue Exercising 3 times per week   Role Documenting the Problem One  Clinical Pharmacist   Care Plan for Problem One  Active   [facility] Long Term Goal (31-90 days)  Patient will exercise 3 times per week, for 30 minutes per session, over the next 90 days. This will be evidenced by patient report.   [facility] Long Term Goal Start Date  07/17/15   Interventions for Problem One Long Term Goal  Encouraged patient to continue exercising    [facility] CM Care Plan Problem Two        Most Recent Value   Care Plan Problem Two  Lose 10 pounds   Role Documenting the Problem Two  Clinical Social Worker   Care Plan for Problem Two  Active   Interventions for Problem Two Long Term Goal   Encouraged patient to continue exercising and following diabetic diet.   [facility] Long Term Goal (31-90) days  Patient will lose 10 pounds over the next 6 months as evidenced by patient report [Pharmacist will contact patient at 90 days to check progress]   [facility] Long Term Goal Start Date  07/17/15    [facility] CM Care Plan Problem Three        Most Recent Value   Care Plan Problem Three   Patient will make an eye exam appointment   Role Documenting the Problem Three  Clinical Pharmacist   Care Plan for Problem Three  Active   [facility] Long Term Goal (31-90) days  Patient will call her eye doctor to make an appointment for an eye exam over the next 90 days   [facility] Long Term Goal Start Date  07/17/15   Interventions for Problem Three Long Term Goal  Encouraged patient to call her eye doctor and make an appointment     Assessment: 1. Diabetes: A1c at goal of less than 7%. Patient states she is not on an ACE inhibitor due to orthostatic hypotension. Patient states she is not taking aspirin as she bruises easily. 2. Cholesterol: TC not at goal of less than 200 mg/dl. TG not at goal of less than 150 mg/dl. LDL not at goal of less than 100 mg/dl. Per ADA 2017 guidelines, high intensity statin use is recommended (based on age and LDL).   Plan: 1. Patient to follow up with her primary care physician in April for her diabetes. 2. Encouraged patient to discuss the addition of a statin with her primary care physician. 3. Patient to make an appointment for an eye exam, continue exercising and lose 10 pounds before next pharmacist visit in 6 months.   Lanya Bucks [name], PharmD [place] [facility] [phone]

## 2015-07-22 ENCOUNTER — Ambulatory Visit: Payer: Self-pay | Admitting: Physician Assistant

## 2015-07-22 ENCOUNTER — Encounter: Payer: Self-pay | Admitting: Physician Assistant

## 2015-07-22 VITALS — BP 140/88 | HR 96 | Temp 98.6°F

## 2015-07-22 DIAGNOSIS — J209 Acute bronchitis, unspecified: Secondary | ICD-10-CM

## 2015-07-22 MED ORDER — METHYLPREDNISOLONE 4 MG PO TBPK: ORAL_TABLET | ORAL | Status: DC

## 2015-07-22 MED ORDER — ALBUTEROL SULFATE HFA 108 (90 BASE) MCG/ACT IN AERS: 2.0000 | INHALATION_SPRAY | Freq: Four times a day (QID) | RESPIRATORY_TRACT | Status: DC | PRN

## 2015-07-22 MED ORDER — AZITHROMYCIN 250 MG PO TABS: ORAL_TABLET | ORAL | Status: DC

## 2015-07-22 NOTE — Progress Notes (Signed)
S: C/o cough and congestion with wheezing , chest is sore from coughing, denies fever, chills,  cough is dry and hacking; keeping pt awake at night;  denies cardiac type chest pain or sob, v/d, abd pain Remainder ros neg  O: vitals wnl, nad, tms clear, throat injected, neck supple no lymph, lungs with wheezing in r lower lung, cv rrr, neuro intact  A:  Acute bronchitis   P:  rx medication: zpack, albuterol, medrol dose ;  use otc meds, tylenol or motrin as needed for fever/chills, return if not better in 3 -5 days, return earlier if worsening

## 2015-07-30 DIAGNOSIS — H5213 Myopia, bilateral: Secondary | ICD-10-CM | POA: Diagnosis not present

## 2015-09-16 ENCOUNTER — Other Ambulatory Visit: Payer: Self-pay | Admitting: Orthopedic Surgery

## 2015-09-16 DIAGNOSIS — M25512 Pain in left shoulder: Secondary | ICD-10-CM | POA: Diagnosis not present

## 2015-09-16 DIAGNOSIS — M50123 Cervical disc disorder at C6-C7 level with radiculopathy: Secondary | ICD-10-CM

## 2015-09-17 ENCOUNTER — Ambulatory Visit
Admission: RE | Admit: 2015-09-17 | Discharge: 2015-09-17 | Disposition: A | Discharge status: Home / Self Care | Payer: 59 | Source: Ambulatory Visit | Attending: Orthopedic Surgery | Admitting: Orthopedic Surgery

## 2015-09-17 DIAGNOSIS — M50223 Other cervical disc displacement at C6-C7 level: Secondary | ICD-10-CM | POA: Insufficient documentation

## 2015-09-17 DIAGNOSIS — M50222 Other cervical disc displacement at C5-C6 level: Secondary | ICD-10-CM | POA: Insufficient documentation

## 2015-09-17 DIAGNOSIS — M5022 Other cervical disc displacement, mid-cervical region, unspecified level: Secondary | ICD-10-CM | POA: Diagnosis not present

## 2015-09-17 DIAGNOSIS — M4802 Spinal stenosis, cervical region: Secondary | ICD-10-CM | POA: Diagnosis not present

## 2015-09-17 DIAGNOSIS — M50123 Cervical disc disorder at C6-C7 level with radiculopathy: Secondary | ICD-10-CM | POA: Insufficient documentation

## 2015-09-19 ENCOUNTER — Ambulatory Visit: Payer: 59 | Attending: Anesthesiology | Admitting: Anesthesiology

## 2015-09-19 ENCOUNTER — Encounter: Payer: Self-pay | Admitting: Anesthesiology

## 2015-09-19 VITALS — BP 109/63 | HR 73 | Temp 98.5°F | Resp 12 | Ht 66.0 in | Wt 180.0 lb

## 2015-09-19 DIAGNOSIS — E119 Type 2 diabetes mellitus without complications: Secondary | ICD-10-CM | POA: Insufficient documentation

## 2015-09-19 DIAGNOSIS — Z96652 Presence of left artificial knee joint: Secondary | ICD-10-CM | POA: Insufficient documentation

## 2015-09-19 DIAGNOSIS — N281 Cyst of kidney, acquired: Secondary | ICD-10-CM | POA: Diagnosis not present

## 2015-09-19 DIAGNOSIS — K219 Gastro-esophageal reflux disease without esophagitis: Secondary | ICD-10-CM | POA: Insufficient documentation

## 2015-09-19 DIAGNOSIS — Z87891 Personal history of nicotine dependence: Secondary | ICD-10-CM | POA: Insufficient documentation

## 2015-09-19 DIAGNOSIS — M5136 Other intervertebral disc degeneration, lumbar region: Secondary | ICD-10-CM | POA: Diagnosis not present

## 2015-09-19 DIAGNOSIS — E039 Hypothyroidism, unspecified: Secondary | ICD-10-CM | POA: Diagnosis not present

## 2015-09-19 DIAGNOSIS — M542 Cervicalgia: Secondary | ICD-10-CM | POA: Diagnosis not present

## 2015-09-19 DIAGNOSIS — M8580 Other specified disorders of bone density and structure, unspecified site: Secondary | ICD-10-CM | POA: Diagnosis not present

## 2015-09-19 DIAGNOSIS — M5412 Radiculopathy, cervical region: Secondary | ICD-10-CM | POA: Diagnosis not present

## 2015-09-19 DIAGNOSIS — F329 Major depressive disorder, single episode, unspecified: Secondary | ICD-10-CM | POA: Insufficient documentation

## 2015-09-19 MED ORDER — LACTATED RINGERS IV SOLN: 1000.0000 mL | INTRAVENOUS | Status: DC

## 2015-09-19 MED ORDER — FENTANYL CITRATE (PF) 100 MCG/2ML IJ SOLN
INTRAMUSCULAR | Status: AC
  Filled 2015-09-19: qty 2

## 2015-09-19 MED ORDER — LIDOCAINE HCL (PF) 1 % IJ SOLN: 5.0000 mL | Freq: Once | INTRAMUSCULAR | Status: DC

## 2015-09-19 MED ORDER — ROPIVACAINE HCL 2 MG/ML IJ SOLN: 10.0000 mL | Freq: Once | INTRAMUSCULAR | Status: DC

## 2015-09-19 MED ORDER — TRIAMCINOLONE ACETONIDE 40 MG/ML IJ SUSP: 40.0000 mg | Freq: Once | INTRAMUSCULAR | Status: DC

## 2015-09-19 MED ORDER — DEXAMETHASONE SODIUM PHOSPHATE 10 MG/ML IJ SOLN
INTRAMUSCULAR | Status: AC
  Administered 2015-09-19: 15:00:00
  Filled 2015-09-19: qty 1

## 2015-09-19 MED ORDER — IOPAMIDOL (ISOVUE-M 200) INJECTION 41%
INTRAMUSCULAR | Status: AC
  Administered 2015-09-19: 15:00:00
  Filled 2015-09-19: qty 10

## 2015-09-19 MED ORDER — IOPAMIDOL (ISOVUE-M 200) INJECTION 41%: 20.0000 mL | Freq: Once | INTRAMUSCULAR | Status: DC | PRN

## 2015-09-19 MED ORDER — DEXAMETHASONE SODIUM PHOSPHATE 4 MG/ML IJ SOLN
INTRAMUSCULAR | Status: AC
Start: 2015-09-19 — End: 2015-09-19
  Administered 2015-09-19: 15:00:00
  Filled 2015-09-19: qty 1

## 2015-09-19 MED ORDER — DEXAMETHASONE SODIUM PHOSPHATE 4 MG/ML IJ SOLN: 4.0000 mg | Freq: Once | INTRAMUSCULAR | Status: DC

## 2015-09-19 MED ORDER — SODIUM CHLORIDE 0.9% FLUSH: 10.0000 mL | Freq: Once | INTRAVENOUS | Status: DC

## 2015-09-19 MED ORDER — ROPIVACAINE HCL 2 MG/ML IJ SOLN
INTRAMUSCULAR | Status: AC
  Administered 2015-09-19: 15:00:00
  Filled 2015-09-19: qty 10

## 2015-09-19 MED ORDER — MIDAZOLAM HCL 2 MG/2ML IJ SOLN: 5.0000 mg | Freq: Once | INTRAMUSCULAR | Status: DC

## 2015-09-19 MED ORDER — LIDOCAINE HCL (PF) 1 % IJ SOLN
INTRAMUSCULAR | Status: AC
  Administered 2015-09-19: 15:00:00
  Filled 2015-09-19: qty 5

## 2015-09-19 MED ORDER — SODIUM CHLORIDE 0.9 % IJ SOLN
INTRAMUSCULAR | Status: AC
Start: 2015-09-19 — End: 2015-09-19
  Administered 2015-09-19: 15:00:00
  Filled 2015-09-19: qty 10

## 2015-09-19 MED ORDER — MIDAZOLAM HCL 5 MG/5ML IJ SOLN
INTRAMUSCULAR | Status: AC
Start: 2015-09-19 — End: 2015-09-19
  Administered 2015-09-19: 2 mg via INTRAVENOUS
  Filled 2015-09-19: qty 5

## 2015-09-19 NOTE — Progress Notes (Signed)
Patient here today as new patient for acute exacerbation of Left neck pain that flared on Sunday 09/15/15.  There have been some ongoing problems over the past 3 weeks but became worse on Sunday causing radiating pain down the left arm into fingers with numbness and tingling.  Safety precautions to be maintained throughout the outpatient stay will include: orient to surroundings, keep bed in low position, maintain call bell within reach at all times, provide assistance with transfer out of bed and ambulation.

## 2015-09-19 NOTE — Patient Instructions (Signed)
Pain Management Discharge Instructions  General Discharge Instructions :  If you need to reach your doctor call: Monday-Friday 8:00 am - 4:00 pm at [phone] or toll free [phone].  After clinic hours [phone] to have operator reach doctor.  Bring all of your medication bottles to all your appointments in the pain clinic.  To cancel or reschedule your appointment with Pain Management please remember to call 24 hours in advance to avoid a fee.  Refer to the educational materials which you have been given on: General Risks, I had my Procedure. Discharge Instructions, Post Sedation.  Post Procedure Instructions:  The drugs you were given will stay in your system until tomorrow, so for the next 24 hours you should not drive, make any legal decisions or drink any alcoholic beverages.  You may eat anything you prefer, but it is better to start with liquids then soups and crackers, and gradually work up to solid foods.  Please notify your doctor immediately if you have any unusual bleeding, trouble breathing or pain that is not related to your normal pain.  Depending on the type of procedure that was done, some parts of your body may feel week and/or numb.  This usually clears up by tonight or the next day.  Walk with the use of an assistive device or accompanied by an adult for the 24 hours.  You may use ice on the affected area for the first 24 hours.  Put ice in a Ziploc bag and cover with a towel and place against area 15 minutes on 15 minutes off.  You may switch to heat after 24 hours.Trigger Point Injection Trigger points are areas where you have muscle pain. A trigger point injection is a shot given in the trigger point to relieve that pain. A trigger point might feel like a knot in your muscle. It hurts to press on a trigger point. Sometimes the pain spreads out (radiates) to other parts of the body. For example, pressing on a trigger point in your shoulder might cause pain in  your arm or neck. You might have one trigger point. Or, you might have more than one. People often have trigger points in their upper back and lower back. They also occur often in the neck and shoulders. Pain from a trigger point lasts for a long time. It can make it hard to keep moving. You might not be able to do the exercise or physical therapy that could help you deal with the pain. A trigger point injection may help. It does not work for everyone. But, it may relieve your pain for a few days or a few months. A trigger point injection does not cure long-lasting (chronic) pain. LET YOUR CAREGIVER KNOW ABOUT:  Any allergies (especially to latex, lidocaine, or steroids).  Blood-thinning medicines that you take. These drugs can lead to bleeding or bruising after an injection. They include:  Aspirin.  Ibuprofen.  Clopidogrel.  Warfarin.  Other medicines you take. This includes all vitamins, herbs, eyedrops, over-the-counter medicines, and creams.  Use of steroids.  Recent infections.  Past problems with numbing medicines.  Bleeding problems.  Surgeries you have had.  Other health problems. RISKS AND COMPLICATIONS A trigger point injection is a safe treatment. However, problems may develop, such as:  Minor side effects usually go away in 1 to 2 days. These may include:  Soreness.  Bruising.  Stiffness.  More serious problems are rare. But, they may include:  Bleeding under the skin (hematoma).  Skin infection.  Breaking off of the needle under your skin.  Lung puncture.  The trigger point injection may not work for you. BEFORE THE PROCEDURE You may need to stop taking any medicine that thins your blood. This is to prevent bleeding and bruising. Usually these medicines are stopped several days before the injection. No other preparation is needed. PROCEDURE  A trigger point injection can be given in your caregiver's office or in a clinic. Each injection takes 2  minutes or less.  Your caregiver will feel for trigger points. The caregiver may use a marker to circle the area for the injection.  The skin over the trigger point will be washed with a germ-killing (antiseptic) solution.  The caregiver pinches the spot for the injection.  Then, a very thin needle is used for the shot. You may feel pain or a twitching feeling when the needle enters the trigger point.  A numbing solution may be injected into the trigger point. Sometimes a drug to keep down swelling, redness, and warmth (inflammation) is also injected.  Your caregiver moves the needle around the trigger zone until the tightness and twitching goes away.  After the injection, your caregiver may put gentle pressure over the injection site.  Then it is covered with a bandage. AFTER THE PROCEDURE  You can go right home after the injection.  The bandage can be taken off after a few hours.  You may feel sore and stiff for 1 to 2 days.  Go back to your regular activities slowly. Your caregiver may ask you to stretch your muscles. Do not do anything that takes extra energy for a few days.  Follow your caregiver's instructions to manage and treat other pain.   This information is not intended to replace advice given to you by your health care provider. Make sure you discuss any questions you have with your health care provider.   Document Released: 05/07/2011 Document Revised: 09/12/2012 Document Reviewed: 05/07/2011 Elsevier Interactive Patient Education 2016 Elsevier Inc. Epidural Steroid Injection An epidural steroid injection is given to relieve pain in your neck, back, or legs that is caused by the irritation or swelling of a nerve root. This procedure involves injecting a steroid and numbing medicine (anesthetic) into the epidural space. The epidural space is the space between the outer covering of your spinal cord and the bones that form your backbone (vertebra).  LET [facility]  CARE PROVIDER KNOW ABOUT:   Any allergies you have.  All medicines you are taking, including vitamins, herbs, eye drops, creams, and over-the-counter medicines such as aspirin.  Previous problems you or members of your family have had with the use of anesthetics.  Any blood disorders or blood clotting disorders you have.  Previous surgeries you have had.  Medical conditions you have. RISKS AND COMPLICATIONS Generally, this is a safe procedure. However, as with any procedure, complications can occur. Possible complications of epidural steroid injection include:  Headache.  Bleeding.  Infection.  Allergic reaction to the medicines.  Damage to your nerves. The response to this procedure depends on the underlying cause of the pain and its duration. People who have long-term (chronic) pain are less likely to benefit from epidural steroids than are those people whose pain comes on strong and suddenly. BEFORE THE PROCEDURE   Ask your health care provider about changing or stopping your regular medicines. You may be advised to stop taking blood-thinning medicines a few days before the procedure.  You may be given  medicines to reduce anxiety.  Arrange for someone to take you home after the procedure. PROCEDURE   You will remain awake during the procedure. You may receive medicine to make you relaxed.  You will be asked to lie on your stomach.  The injection site will be cleaned.  The injection site will be numbed with a medicine (local anesthetic).  A needle will be injected through your skin into the epidural space.  Your health care provider will use an X-ray machine to ensure that the steroid is delivered closest to the affected nerve. You may have minimal discomfort at this time.  Once the needle is in the right position, the local anesthetic and the steroid will be injected into the epidural space.  The needle will then be removed and a bandage will be applied to the  injection site. AFTER THE PROCEDURE   You may be monitored for a short time before you go home.  You may feel weakness or numbness in your arm or leg, which disappears within hours.  You may be allowed to eat, drink, and take your regular medicine.  You may have soreness at the site of the injection.   This information is not intended to replace advice given to you by your health care provider. Make sure you discuss any questions you have with your health care provider.   Document Released: 08/25/2007 Document Revised: 01/18/2013 Document Reviewed: 11/04/2012 Elsevier Interactive Patient Education [employer].

## 2015-09-20 ENCOUNTER — Telehealth: Payer: Self-pay | Admitting: *Deleted

## 2015-09-20 ENCOUNTER — Ambulatory Visit: Payer: Self-pay | Admitting: Anesthesiology

## 2015-09-20 NOTE — Telephone Encounter (Signed)
Spoke with patient re; procedure on yesterday, verbalizes good relief and a great nights sleep last night.  Denies any complications or concerns.

## 2015-09-20 NOTE — Progress Notes (Signed)
Subjective:  Patient ID: Angela Davenport, female    DOB: 10/01/1960  Age: 55 y.o. MRN: [id number]  CC: Neck Pain   HPI WYNEE NUDD presents for a new patient evaluation today. She started having left shoulder pain with radiation into the left third fourth and fifth fingers approximately 3 weeks ago. The pain has been incapacitating with a maximum VAS score of 9 best a 7 and she has been unable to get any significant improvement. She describes numbness in the third fourth and fifth fingers of the constant nature with associated shooting pain. Pain is aggravated when she is ambulatory and ice packs to the neck and TENS application and given minimal relief. She has failed conservative therapy. She also describes some weakness in the left tricep region with diminished hand grasp on the left side as well.  History Akasia has a past medical history of Kidney cysts; Hot flashes; Hypothyroidism; Depression; Diabetes mellitus without complication ([place]); Osteoarthritis; Fibrocystic breast; Osteopenia; Gastritis; Hiatal hernia; History of shingles; and GERD (gastroesophageal reflux disease).   She has past surgical history that includes Tubal ligation; Wisdom tooth extraction; Knee arthroscopy (Left); Kidney cyst removal (Left); Knee arthroscopy (Left, 08/01/2013); and Medial partial knee replacement (Left).   Her family history includes Alzheimer's disease in her mother; Cancer in her father and mother; Diabetes in her father; Hypertension in her mother.She reports that she quit smoking about 32 years ago. She has never used smokeless tobacco. She reports that she drinks alcohol. She reports that she does not use illicit drugs.   ---------------------------------------------------------------------------------------------------------------------- Past Medical History  Diagnosis Date  . Kidney cysts   . Hot flashes     history of- states controlled with hormonal gel and Effexor  . Hypothyroidism    . Depression   . Diabetes mellitus without complication ([place])   . Osteoarthritis   . Fibrocystic breast   . Osteopenia   . Gastritis   . Hiatal hernia   . History of shingles   . GERD (gastroesophageal reflux disease)     No longer has issues    Past Surgical History  Procedure Laterality Date  . Tubal ligation    . Wisdom tooth extraction    . Knee arthroscopy Left   . Kidney cyst removal Left     IV sedation  . Knee arthroscopy Left 08/01/2013    Procedure: ARTHROSCOPY LEFT KNEE WITH MEDIAL MENISCUS DEBRIDEMENT AND CHONDRPLASTY;  Surgeon: Gearlean Alf, MD;  Location: [facility];  Service: Orthopedics;  Laterality: Left;  . Medial partial knee replacement Left     Family History  Problem Relation Age of Onset  . Cancer Mother   . Alzheimer's disease Mother   . Hypertension Mother   . Cancer Father   . Diabetes Father     Steroid induced    Social History  Substance Use Topics  . Smoking status: Former Smoker -- 1.50 packs/day for 8 years    Quit date: 07/29/1983  . Smokeless tobacco: Never Used  . Alcohol Use: 0.0 oz/week    0 Standard drinks or equivalent per week     Comment: socially    ---------------------------------------------------------------------------------------------------------------------- Social History   Social History  . Marital Status: Married    Spouse Name: N/A  . Number of Children: N/A  . Years of Education: N/A   Social History Main Topics  . Smoking status: Former Smoker -- 1.50 packs/day for 8 years    Quit date: 07/29/1983  . Smokeless tobacco: Never Used  .  Alcohol Use: 0.0 oz/week    0 Standard drinks or equivalent per week     Comment: socially  . Drug Use: No  . Sexual Activity: Not Asked   Other Topics Concern  . None   Social History Narrative      ----------------------------------------------------------------------------------------------------------------------  ROS Review of Systems  Cardiovascular:  Negative Pulmonary: Negative Psychological negative GU: Negative Hematologic negative   Objective:  BP 109/63 mmHg  Pulse 73  Temp(Src) 98.5 F (36.9 C) (Oral)  Resp 12  Ht 5\' 6"  (1.676 m)  Wt 180 lb (81.647 kg)  BMI 29.07 kg/m2  SpO2 98%  Physical Exam Patient is a good historian in obvious pain. Pupils are equally round reactive to light and extraocular muscles are intact Heart is regular rate and rhythm without murmur Lungs are clear to auscultation Inspection of the left trapezius reveals a trigger point 2 in the mid body and diminished strength with extension of the left arm at the elbow as compared to the right she also has a slightly diminished hand grasp on the left side compared with the right right upper extremity strength appears to be well-preserved without apparent dysfunction    Assessment & Plan:   Mylee was seen today for neck pain.  Diagnoses and all orders for this visit:  Cervicalgia -     triamcinolone acetonide (KENALOG-40) injection 40 mg; 1 mL (40 mg total) by Other route once. -     sodium chloride flush (NS) 0.9 % injection 10 mL; 10 mLs by Other route once. -     ropivacaine (PF) 2 mg/ml (0.2%) (NAROPIN) epidural 10 mL; 10 mLs by Epidural route once. -     midazolam (VERSED) injection 5 mg; Inject 5 mLs (5 mg total) into the vein once. -     lidocaine (PF) (XYLOCAINE) 1 % injection 5 mL; Inject 5 mLs into the skin once. -     lactated ringers infusion 1,000 mL; Inject 1,000 mLs into the vein continuous. -     iopamidol (ISOVUE-M) 41 % intrathecal injection 20 mL; 20 mLs by Epidural route once as needed for contrast. -     dexamethasone (DECADRON) injection 4 mg; 1 mL (4 mg total) by Other route once. -     CERVICAL EPIDURAL STEROID INJECTION; Future -     TRIGGER POINT INJECTION -     Ambulatory referral to Neurosurgery  DDD (degenerative disc disease), lumbar -     triamcinolone acetonide (KENALOG-40) injection 40 mg; 1 mL (40 mg total) by  Other route once. -     sodium chloride flush (NS) 0.9 % injection 10 mL; 10 mLs by Other route once. -     ropivacaine (PF) 2 mg/ml (0.2%) (NAROPIN) epidural 10 mL; 10 mLs by Epidural route once. -     midazolam (VERSED) injection 5 mg; Inject 5 mLs (5 mg total) into the vein once. -     lidocaine (PF) (XYLOCAINE) 1 % injection 5 mL; Inject 5 mLs into the skin once. -     lactated ringers infusion 1,000 mL; Inject 1,000 mLs into the vein continuous. -     iopamidol (ISOVUE-M) 41 % intrathecal injection 20 mL; 20 mLs by Epidural route once as needed for contrast. -     dexamethasone (DECADRON) injection 4 mg; 1 mL (4 mg total) by Other route once. -     CERVICAL EPIDURAL STEROID INJECTION; Future -     Ambulatory referral to Neurosurgery  Cervical radiculitis -  triamcinolone acetonide (KENALOG-40) injection 40 mg; 1 mL (40 mg total) by Other route once. -     sodium chloride flush (NS) 0.9 % injection 10 mL; 10 mLs by Other route once. -     ropivacaine (PF) 2 mg/ml (0.2%) (NAROPIN) epidural 10 mL; 10 mLs by Epidural route once. -     midazolam (VERSED) injection 5 mg; Inject 5 mLs (5 mg total) into the vein once. -     lidocaine (PF) (XYLOCAINE) 1 % injection 5 mL; Inject 5 mLs into the skin once. -     lactated ringers infusion 1,000 mL; Inject 1,000 mLs into the vein continuous. -     iopamidol (ISOVUE-M) 41 % intrathecal injection 20 mL; 20 mLs by Epidural route once as needed for contrast. -     dexamethasone (DECADRON) injection 4 mg; 1 mL (4 mg total) by Other route once. -     CERVICAL EPIDURAL STEROID INJECTION; Future -     Ambulatory referral to Neurosurgery  Other orders -     ropivacaine (PF) 2 mg/ml (0.2%) (NAROPIN) 2 MG/ML epidural;  -     sodium chloride 0.9 % injection;  -     fentaNYL (SUBLIMAZE) 100 MCG/2ML injection;  -     dexamethasone (DECADRON) 10 MG/ML injection;  -     dexamethasone (DECADRON) 4 MG/ML injection;  -     midazolam (VERSED) 5 MG/5ML injection;   -     lidocaine (PF) (XYLOCAINE) 1 % injection;  -     iopamidol (ISOVUE-M) 41 % intrathecal injection;      ----------------------------------------------------------------------------------------------------------------------  Problem List Items Addressed This Visit    None    Visit Diagnoses    Cervicalgia    -  Primary    Relevant Medications    triamcinolone acetonide (KENALOG-40) injection 40 mg    sodium chloride flush (NS) 0.9 % injection 10 mL    ropivacaine (PF) 2 mg/ml (0.2%) (NAROPIN) epidural 10 mL    midazolam (VERSED) injection 5 mg    lidocaine (PF) (XYLOCAINE) 1 % injection 5 mL    lactated ringers infusion 1,000 mL    iopamidol (ISOVUE-M) 41 % intrathecal injection 20 mL    dexamethasone (DECADRON) injection 4 mg    Other Relevant Orders    CERVICAL EPIDURAL STEROID INJECTION    TRIGGER POINT INJECTION    Ambulatory referral to Neurosurgery    DDD (degenerative disc disease), lumbar        Relevant Medications    triamcinolone acetonide (KENALOG-40) injection 40 mg    sodium chloride flush (NS) 0.9 % injection 10 mL    ropivacaine (PF) 2 mg/ml (0.2%) (NAROPIN) epidural 10 mL    midazolam (VERSED) injection 5 mg    lidocaine (PF) (XYLOCAINE) 1 % injection 5 mL    lactated ringers infusion 1,000 mL    iopamidol (ISOVUE-M) 41 % intrathecal injection 20 mL    dexamethasone (DECADRON) injection 4 mg    dexamethasone (DECADRON) 10 MG/ML injection (Completed)    dexamethasone (DECADRON) 4 MG/ML injection (Completed)    Other Relevant Orders    CERVICAL EPIDURAL STEROID INJECTION    Ambulatory referral to Neurosurgery    Cervical radiculitis        Relevant Medications    triamcinolone acetonide (KENALOG-40) injection 40 mg    sodium chloride flush (NS) 0.9 % injection 10 mL    ropivacaine (PF) 2 mg/ml (0.2%) (NAROPIN) epidural 10 mL    midazolam (VERSED) injection 5  mg    lidocaine (PF) (XYLOCAINE) 1 % injection 5 mL    lactated ringers infusion 1,000 mL     iopamidol (ISOVUE-M) 41 % intrathecal injection 20 mL    dexamethasone (DECADRON) injection 4 mg    midazolam (VERSED) 5 MG/5ML injection (Completed)    Other Relevant Orders    CERVICAL EPIDURAL STEROID INJECTION    Ambulatory referral to Neurosurgery       ----------------------------------------------------------------------------------------------------------------------  1. Cervicalgia I've had a long discussion with Kija and she would like to proceed with a cervical epidural steroid today and trigger point injection today. The risks and benefits of an reviewed all questions answered is to return in one month for reevaluation - triamcinolone acetonide (KENALOG-40) injection 40 mg; 1 mL (40 mg total) by Other route once. - sodium chloride flush (NS) 0.9 % injection 10 mL; 10 mLs by Other route once. - ropivacaine (PF) 2 mg/ml (0.2%) (NAROPIN) epidural 10 mL; 10 mLs by Epidural route once. - midazolam (VERSED) injection 5 mg; Inject 5 mLs (5 mg total) into the vein once. - lidocaine (PF) (XYLOCAINE) 1 % injection 5 mL; Inject 5 mLs into the skin once. - lactated ringers infusion 1,000 mL; Inject 1,000 mLs into the vein continuous. - iopamidol (ISOVUE-M) 41 % intrathecal injection 20 mL; 20 mLs by Epidural route once as needed for contrast. - dexamethasone (DECADRON) injection 4 mg; 1 mL (4 mg total) by Other route once. - CERVICAL EPIDURAL STEROID INJECTION; Future - TRIGGER POINT INJECTION - Ambulatory referral to Neurosurgery  2. DDD (degenerative disc disease), lumbar As per #1 - triamcinolone acetonide (KENALOG-40) injection 40 mg; 1 mL (40 mg total) by Other route once. - sodium chloride flush (NS) 0.9 % injection 10 mL; 10 mLs by Other route once. - ropivacaine (PF) 2 mg/ml (0.2%) (NAROPIN) epidural 10 mL; 10 mLs by Epidural route once. - midazolam (VERSED) injection 5 mg; Inject 5 mLs (5 mg total) into the vein once. - lidocaine (PF) (XYLOCAINE) 1 % injection 5 mL;  Inject 5 mLs into the skin once. - lactated ringers infusion 1,000 mL; Inject 1,000 mLs into the vein continuous. - iopamidol (ISOVUE-M) 41 % intrathecal injection 20 mL; 20 mLs by Epidural route once as needed for contrast. - dexamethasone (DECADRON) injection 4 mg; 1 mL (4 mg total) by Other route once. - CERVICAL EPIDURAL STEROID INJECTION; Future - Ambulatory referral to Neurosurgery  3. Cervical radiculitis  - triamcinolone acetonide (KENALOG-40) injection 40 mg; 1 mL (40 mg total) by Other route once. - sodium chloride flush (NS) 0.9 % injection 10 mL; 10 mLs by Other route once. - ropivacaine (PF) 2 mg/ml (0.2%) (NAROPIN) epidural 10 mL; 10 mLs by Epidural route once. - midazolam (VERSED) injection 5 mg; Inject 5 mLs (5 mg total) into the vein once. - lidocaine (PF) (XYLOCAINE) 1 % injection 5 mL; Inject 5 mLs into the skin once. - lactated ringers infusion 1,000 mL; Inject 1,000 mLs into the vein continuous. - iopamidol (ISOVUE-M) 41 % intrathecal injection 20 mL; 20 mLs by Epidural route once as needed for contrast. - dexamethasone (DECADRON) injection 4 mg; 1 mL (4 mg total) by Other route once. - CERVICAL EPIDURAL STEROID INJECTION; Future - Ambulatory referral to Neurosurgery    ----------------------------------------------------------------------------------------------------------------------  I am having Ms. Wedel maintain her levothyroxine, omeprazole, loratadine, venlafaxine XR, naproxen sodium, traMADol, PRESCRIPTION MEDICATION, HYDROcodone-acetaminophen, methocarbamol, levothyroxine, venlafaxine XR, aspirin EC, metFORMIN, methylPREDNISolone, albuterol, and azithromycin. We administered ropivacaine (PF) 2 mg/ml (0.2%), sodium chloride, dexamethasone, dexamethasone,  midazolam, lidocaine (PF), and iopamidol. We will continue to administer triamcinolone acetonide, sodium chloride flush, ropivacaine (PF) 2 mg/ml (0.2%), midazolam, lidocaine (PF), lactated ringers, iopamidol,  and dexamethasone.   Meds ordered this encounter  Medications  . triamcinolone acetonide (KENALOG-40) injection 40 mg    Sig:   . sodium chloride flush (NS) 0.9 % injection 10 mL    Sig:   . ropivacaine (PF) 2 mg/ml (0.2%) (NAROPIN) epidural 10 mL    Sig:   . midazolam (VERSED) injection 5 mg    Sig:   . lidocaine (PF) (XYLOCAINE) 1 % injection 5 mL    Sig:   . lactated ringers infusion 1,000 mL    Sig:   . iopamidol (ISOVUE-M) 41 % intrathecal injection 20 mL    Sig:   . dexamethasone (DECADRON) injection 4 mg    Sig:   . ropivacaine (PF) 2 mg/ml (0.2%) (NAROPIN) 2 MG/ML epidural    Sig:     Sharlett Iles, Delores: cabinet override  . sodium chloride 0.9 % injection    Sig:     Sharlett Iles, Delores: cabinet override  . fentaNYL (SUBLIMAZE) 100 MCG/2ML injection    Sig:     Sharlett Iles, Delores: cabinet override  . dexamethasone (DECADRON) 10 MG/ML injection    Sig:     Sharlett Iles, Delores: cabinet override  . dexamethasone (DECADRON) 4 MG/ML injection    Sig:     Sharlett Iles, Delores: cabinet override  . midazolam (VERSED) 5 MG/5ML injection    Sig:     Sharlett Iles, Delores: cabinet override  . lidocaine (PF) (XYLOCAINE) 1 % injection    Sig:     Sharlett Iles, Delores: cabinet override  . iopamidol (ISOVUE-M) 41 % intrathecal injection    Sig:     Sharlett Iles, Delores: cabinet override       Follow-up: Return in about 3 weeks (around 10/10/2015) for evaluation, procedure. patient was taken to the fluoroscopy suite and placed in the prone position. IV sedation was with Versed yielding mild sedation for the procedure. The patient was responsive throughout and vital signs were stable throughout. The area overlying the cervical region was prepped with stereo prep 3 and then 1% lidocaine 2 cc was injected with a 25-gauge needle at the C7-T1 interspace. This was done with strict aseptic technique. I then advanced an 18-gauge Touhy needle using a hanging drop technique and AP fluoroscopic  guidance. There was loss of resistance at approximately 5 cm no paresthesia and clear evidence of epidural placement confirmed with 2 cc of Isovue yielding good epidural spread. Following this I injected a mixture comprised of 10 mg of Decadron 1 cc of normal saline and 1 cc of ropivacaine 0.2%. The needle was withdrawn and the patient tolerated the procedure without difficulty and was convalesced and discharged home for follow-up as mentioned.  The trigger point overlying the left trapezius muscle was also prepped with alcohol and injected with a 25-gauge needle with 7 cc of ropivacaine 0.2% mixed with 4 mg of Decadron in a fanlike distribution and this was well-tolerated and she was convalesced discharged home in stable condition Molli Barrows, MD

## 2015-09-23 ENCOUNTER — Telehealth: Payer: Self-pay | Admitting: Anesthesiology

## 2015-09-23 NOTE — Telephone Encounter (Signed)
Asking how long it should take until steroids result in decrease in pain. Patient advised it could be up to 10 days. Also asking about Gabapentin or Lyrica. Dr. Andree Elk not available this week, she will call J. Wolfe to see if he will prescribe one of these. If not, she will call us back next week and we can ask Dr. Andree Elk if he wil prescribe.

## 2015-09-23 NOTE — Telephone Encounter (Signed)
Please call patient concerning pain she had procedure Thursday   Thank you

## 2015-09-30 ENCOUNTER — Ambulatory Visit: Payer: 59 | Attending: Anesthesiology | Admitting: Anesthesiology

## 2015-09-30 ENCOUNTER — Encounter: Payer: Self-pay | Admitting: Anesthesiology

## 2015-09-30 VITALS — BP 131/89 | HR 91 | Temp 98.2°F | Resp 16 | Ht 66.0 in | Wt 175.0 lb

## 2015-09-30 DIAGNOSIS — K2970 Gastritis, unspecified, without bleeding: Secondary | ICD-10-CM | POA: Diagnosis not present

## 2015-09-30 DIAGNOSIS — K449 Diaphragmatic hernia without obstruction or gangrene: Secondary | ICD-10-CM | POA: Diagnosis not present

## 2015-09-30 DIAGNOSIS — E039 Hypothyroidism, unspecified: Secondary | ICD-10-CM | POA: Insufficient documentation

## 2015-09-30 DIAGNOSIS — F329 Major depressive disorder, single episode, unspecified: Secondary | ICD-10-CM | POA: Diagnosis not present

## 2015-09-30 DIAGNOSIS — M5136 Other intervertebral disc degeneration, lumbar region: Secondary | ICD-10-CM | POA: Insufficient documentation

## 2015-09-30 DIAGNOSIS — Z87891 Personal history of nicotine dependence: Secondary | ICD-10-CM | POA: Insufficient documentation

## 2015-09-30 DIAGNOSIS — M542 Cervicalgia: Secondary | ICD-10-CM

## 2015-09-30 DIAGNOSIS — M8580 Other specified disorders of bone density and structure, unspecified site: Secondary | ICD-10-CM | POA: Insufficient documentation

## 2015-09-30 DIAGNOSIS — K219 Gastro-esophageal reflux disease without esophagitis: Secondary | ICD-10-CM | POA: Diagnosis not present

## 2015-09-30 DIAGNOSIS — M51369 Other intervertebral disc degeneration, lumbar region without mention of lumbar back pain or lower extremity pain: Secondary | ICD-10-CM

## 2015-09-30 DIAGNOSIS — Z8619 Personal history of other infectious and parasitic diseases: Secondary | ICD-10-CM | POA: Insufficient documentation

## 2015-09-30 DIAGNOSIS — N951 Menopausal and female climacteric states: Secondary | ICD-10-CM | POA: Insufficient documentation

## 2015-09-30 DIAGNOSIS — Z96652 Presence of left artificial knee joint: Secondary | ICD-10-CM | POA: Diagnosis not present

## 2015-09-30 DIAGNOSIS — M5412 Radiculopathy, cervical region: Secondary | ICD-10-CM

## 2015-09-30 DIAGNOSIS — N6019 Diffuse cystic mastopathy of unspecified breast: Secondary | ICD-10-CM | POA: Insufficient documentation

## 2015-09-30 DIAGNOSIS — E119 Type 2 diabetes mellitus without complications: Secondary | ICD-10-CM | POA: Diagnosis not present

## 2015-09-30 MED ORDER — CYCLOBENZAPRINE HCL 10 MG PO TABS: 10.0000 mg | ORAL_TABLET | Freq: Every day | ORAL | Status: DC

## 2015-09-30 MED ORDER — DEXAMETHASONE SODIUM PHOSPHATE 10 MG/ML IJ SOLN: 10.0000 mg | Freq: Once | INTRAMUSCULAR | Status: DC

## 2015-09-30 MED ORDER — PREDNISONE 10 MG (21) PO TBPK: ORAL_TABLET | ORAL | Status: DC

## 2015-09-30 MED ORDER — PREDNISONE 10 MG (21) PO TBPK: ORAL_TABLET | Freq: Four times a day (QID) | ORAL | Status: DC

## 2015-09-30 MED ORDER — ROPIVACAINE HCL 2 MG/ML IJ SOLN
INTRAMUSCULAR | Status: AC
  Administered 2015-09-30: 17:00:00
  Filled 2015-09-30: qty 10

## 2015-09-30 MED ORDER — DEXAMETHASONE SODIUM PHOSPHATE 10 MG/ML IJ SOLN
INTRAMUSCULAR | Status: AC
  Filled 2015-09-30: qty 1

## 2015-09-30 MED ORDER — DEXAMETHASONE SODIUM PHOSPHATE 10 MG/ML IJ SOLN
INTRAMUSCULAR | Status: AC
  Administered 2015-09-30: 17:00:00
  Filled 2015-09-30: qty 1

## 2015-09-30 MED ORDER — HYDROCODONE-ACETAMINOPHEN 5-325 MG PO TABS: 1.0000 | ORAL_TABLET | Freq: Four times a day (QID) | ORAL | Status: DC | PRN

## 2015-09-30 MED ORDER — ROPIVACAINE HCL 2 MG/ML IJ SOLN: 10.0000 mL | Freq: Once | INTRAMUSCULAR | Status: DC

## 2015-09-30 NOTE — Patient Instructions (Addendum)
Trigger Point Injection Trigger points are areas where you have muscle pain. A trigger point injection is a shot given in the trigger point to relieve that pain. A trigger point might feel like a knot in your muscle. It hurts to press on a trigger point. Sometimes the pain spreads out (radiates) to other parts of the body. For example, pressing on a trigger point in your shoulder might cause pain in your arm or neck. You might have one trigger point. Or, you might have more than one. People often have trigger points in their upper back and lower back. They also occur often in the neck and shoulders. Pain from a trigger point lasts for a long time. It can make it hard to keep moving. You might not be able to do the exercise or physical therapy that could help you deal with the pain. A trigger point injection may help. It does not work for everyone. But, it may relieve your pain for a few days or a few months. A trigger point injection does not cure long-lasting (chronic) pain. LET YOUR CAREGIVER KNOW ABOUT: 1. Any allergies (especially to latex, lidocaine, or steroids). 2. Blood-thinning medicines that you take. These drugs can lead to bleeding or bruising after an injection. They include: 1. Aspirin. 2. Ibuprofen. 3. Clopidogrel. 4. Warfarin. 3. Other medicines you take. This includes all vitamins, herbs, eyedrops, over-the-counter medicines, and creams. 4. Use of steroids. 5. Recent infections. 6. Past problems with numbing medicines. 7. Bleeding problems. 8. Surgeries you have had. 9. Other health problems. RISKS AND COMPLICATIONS A trigger point injection is a safe treatment. However, problems may develop, such as: 1. Minor side effects usually go away in 1 to 2 days. These may include: 1. Soreness. 2. Bruising. 3. Stiffness. 2. More serious problems are rare. But, they may include: 1. Bleeding under the skin (hematoma). 2. Skin infection. 3. Breaking off of the needle under your  skin. 4. Lung puncture. 3. The trigger point injection may not work for you. BEFORE THE PROCEDURE You may need to stop taking any medicine that thins your blood. This is to prevent bleeding and bruising. Usually these medicines are stopped several days before the injection. No other preparation is needed. PROCEDURE  A trigger point injection can be given in your caregiver's office or in a clinic. Each injection takes 2 minutes or less. 1. Your caregiver will feel for trigger points. The caregiver may use a marker to circle the area for the injection. 2. The skin over the trigger point will be washed with a germ-killing (antiseptic) solution. 3. The caregiver pinches the spot for the injection. 4. Then, a very thin needle is used for the shot. You may feel pain or a twitching feeling when the needle enters the trigger point. 5. A numbing solution may be injected into the trigger point. Sometimes a drug to keep down swelling, redness, and warmth (inflammation) is also injected. 6. Your caregiver moves the needle around the trigger zone until the tightness and twitching goes away. 7. After the injection, your caregiver may put gentle pressure over the injection site. 8. Then it is covered with a bandage. AFTER THE PROCEDURE 1. You can go right home after the injection. 2. The bandage can be taken off after a few hours. 3. You may feel sore and stiff for 1 to 2 days. 4. Go back to your regular activities slowly. Your caregiver may ask you to stretch your muscles. Do not do anything that takes  extra energy for a few days. 5. Follow your caregiver's instructions to manage and treat other pain.   This information is not intended to replace advice given to you by your health care provider. Make sure you discuss any questions you have with your health care provider.   Document Released: 05/07/2011 Document Revised: 09/12/2012 Document Reviewed: 05/07/2011 Elsevier Interactive Patient Education 2016  [employer] Pain Management Discharge Instructions  General Discharge Instructions :  If you need to reach your doctor call: Monday-Friday 8:00 am - 4:00 pm at [phone] or toll free [phone].  After clinic hours [phone] to have operator reach doctor.  Bring all of your medication bottles to all your appointments in the pain clinic.  To cancel or reschedule your appointment with Pain Management please remember to call 24 hours in advance to avoid a fee.  Refer to the educational materials which you have been given on: General Risks, I had my Procedure. Discharge Instructions, Post Sedation.  Post Procedure Instructions:  The drugs you were given will stay in your system until tomorrow, so for the next 24 hours you should not drive, make any legal decisions or drink any alcoholic beverages.  You may eat anything you prefer, but it is better to start with liquids then soups and crackers, and gradually work up to solid foods.  Please notify your doctor immediately if you have any unusual bleeding, trouble breathing or pain that is not related to your normal pain.  Depending on the type of procedure that was done, some parts of your body may feel week and/or numb.  This usually clears up by tonight or the next day.  Walk with the use of an assistive device or accompanied by an adult for the 24 hours.  You may use ice on the affected area for the first 24 hours.  Put ice in a Ziploc bag and cover with a towel and place against area 15 minutes on 15 minutes off.  You may switch to heat after 24 hours.Epidural Steroid Injection Patient Information  Description: The epidural space surrounds the nerves as they exit the spinal cord.  In some patients, the nerves can be compressed and inflamed by a bulging disc or a tight spinal canal (spinal stenosis).  By injecting steroids into the epidural space, we can bring irritated nerves into direct contact with a potentially helpful  medication.  These steroids act directly on the irritated nerves and can reduce swelling and inflammation which often leads to decreased pain.  Epidural steroids may be injected anywhere along the spine and from the neck to the low back depending upon the location of your pain.   After numbing the skin with local anesthetic (like Novocaine), a small needle is passed into the epidural space slowly.  You may experience a sensation of pressure while this is being done.  The entire block usually last less than 10 minutes.  Conditions which may be treated by epidural steroids:   Low back and leg pain  Neck and arm pain  Spinal stenosis  Post-laminectomy syndrome  Herpes zoster (shingles) pain  Pain from compression fractures  Preparation for the injection:  4. Do not eat any solid food or dairy products within 8 hours of your appointment.  5. You may drink clear liquids up to 3 hours before appointment.  Clear liquids include water, black coffee, juice or soda.  No milk or cream please. 6. You may take your regular medication, including pain medications, with a sip of  water before your appointment  Diabetics should hold regular insulin (if taken separately) and take 1/2 normal NPH dos the morning of the procedure.  Carry some sugar containing items with you to your appointment. 7. A driver must accompany you and be prepared to drive you home after your procedure.  8. Bring all your current medications with your. 9. An IV may be inserted and sedation may be given at the discretion of the physician.   10. A blood pressure cuff, EKG and other monitors will often be applied during the procedure.  Some patients may need to have extra oxygen administered for a short period. 11. You will be asked to provide medical information, including your allergies, prior to the procedure.  We must know immediately if you are taking blood thinners (like Coumadin/Warfarin)  Or if you are allergic to IV iodine  contrast (dye). We must know if you could possible be pregnant.  Possible side-effects:  Bleeding from needle site  Infection (rare, may require surgery)  Nerve injury (rare)  Numbness & tingling (temporary)  Difficulty urinating (rare, temporary)  Spinal headache ( a headache worse with upright posture)  Light -headedness (temporary)  Pain at injection site (several days)  Decreased blood pressure (temporary)  Weakness in arm/leg (temporary)  Pressure sensation in back/neck (temporary)  Call if you experience:  Fever/chills associated with headache or increased back/neck pain.  Headache worsened by an upright position.  New onset weakness or numbness of an extremity below the injection site  Hives or difficulty breathing (go to the emergency room)  Inflammation or drainage at the infection site  Severe back/neck pain  Any new symptoms which are concerning to you  Please note:  Although the local anesthetic injected can often make your back or neck feel good for several hours after the injection, the pain will likely return.  It takes 3-7 days for steroids to work in the epidural space.  You may not notice any pain relief for at least that one week.  If effective, we will often do a series of three injections spaced 3-6 weeks apart to maximally decrease your pain.  After the initial series, we generally will wait several months before considering a repeat injection of the same type.  If you have any questions, please call [phone] [place]  What are the risk, side effects and possible complications? Generally speaking, most procedures are safe.  However, with any procedure there are risks, side effects, and the possibility of complications.  The risks and complications are dependent upon the sites that are lesioned, or the type of nerve block to be performed.  The closer the procedure is to  the spine, the more serious the risks are.  Great care is taken when placing the radio frequency needles, block needles or lesioning probes, but sometimes complications can occur. 1. Infection: Any time there is an injection through the skin, there is a risk of infection.  This is why sterile conditions are used for these blocks.  There are four possible types of infection. 1. Localized skin infection. 2. Central Nervous System Infection-This can be in the form of Meningitis, which can be deadly. 3. Epidural Infections-This can be in the form of an epidural abscess, which can cause pressure inside of the spine, causing compression of the spinal cord with subsequent paralysis. This would require an emergency surgery to decompress, and there are no guarantees that the patient would recover from the  paralysis. 4. Discitis-This is an infection of the intervertebral discs.  It occurs in about 1% of discography procedures.  It is difficult to treat and it may lead to surgery.        2. Pain: the needles have to go through skin and soft tissues, will cause soreness.       3. Damage to internal structures:  The nerves to be lesioned may be near blood vessels or    other nerves which can be potentially damaged.       4. Bleeding: Bleeding is more common if the patient is taking blood thinners such as  aspirin, Coumadin, Ticiid, Plavix, etc., or if he/she have some genetic predisposition  such as hemophilia. Bleeding into the spinal canal can cause compression of the spinal  cord with subsequent paralysis.  This would require an emergency surgery to  decompress and there are no guarantees that the patient would recover from the  paralysis.       5. Pneumothorax:  Puncturing of a lung is a possibility, every time a needle is introduced in  the area of the chest or upper back.  Pneumothorax refers to free air around the  collapsed lung(s), inside of the thoracic cavity (chest cavity).  Another two possible    complications related to a similar event would include: Hemothorax and Chylothorax.   These are variations of the Pneumothorax, where instead of air around the collapsed  lung(s), you may have blood or chyle, respectively.       6. Spinal headaches: They may occur with any procedures in the area of the spine.       7. Persistent CSF (Cerebro-Spinal Fluid) leakage: This is a rare problem, but may occur  with prolonged intrathecal or epidural catheters either due to the formation of a fistulous  track or a dural tear.       8. Nerve damage: By working so close to the spinal cord, there is always a possibility of  nerve damage, which could be as serious as a permanent spinal cord injury with  paralysis.       9. Death:  Although rare, severe deadly allergic reactions known as "Anaphylactic  reaction" can occur to any of the medications used.      10. Worsening of the symptoms:  We can always make thing worse.  What are the chances of something like this happening? Chances of any of this occuring are extremely low.  By statistics, you have more of a chance of getting killed in a motor vehicle accident: while driving to the hospital than any of the above occurring .  Nevertheless, you should be aware that they are possibilities.  In general, it is similar to taking a shower.  Everybody knows that you can slip, hit your head and get killed.  Does that mean that you should not shower again?  Nevertheless always keep in mind that statistics do not mean anything if you happen to be on the wrong side of them.  Even if a procedure has a 1 (one) in a 1,000,000 (million) chance of going wrong, it you happen to be that one..Also, keep in mind that by statistics, you have more of a chance of having something go wrong when taking medications.  Who should not have this procedure? If you are on a blood thinning medication (e.g. Coumadin, Plavix, see list of "Blood Thinners"), or if you have an active infection going on,  you should not have the procedure.  If you are taking any blood thinners, please inform your physician.  How should I prepare for this procedure?  Do not eat or drink anything at least six hours prior to the procedure.  Bring a driver with you .  It cannot be a taxi.  Come accompanied by an adult that can drive you back, and that is strong enough to help you if your legs get weak or numb from the local anesthetic.  Take all of your medicines the morning of the procedure with just enough water to swallow them.  If you have diabetes, make sure that you are scheduled to have your procedure done first thing in the morning, whenever possible.  If you have diabetes, take only half of your insulin dose and notify our nurse that you have done so as soon as you arrive at the clinic.  If you are diabetic, but only take blood sugar pills (oral hypoglycemic), then do not take them on the morning of your procedure.  You may take them after you have had the procedure.  Do not take aspirin or any aspirin-containing medications, at least eleven (11) days prior to the procedure.  They may prolong bleeding.  Wear loose fitting clothing that may be easy to take off and that you would not mind if it got stained with Betadine or blood.  Do not wear any jewelry or perfume  Remove any nail coloring.  It will interfere with some of our monitoring equipment.  NOTE: Remember that this is not meant to be interpreted as a complete list of all possible complications.  Unforeseen problems may occur.  BLOOD THINNERS The following drugs contain aspirin or other products, which can cause increased bleeding during surgery and should not be taken for 2 weeks prior to and 1 week after surgery.  If you should need take something for relief of minor pain, you may take acetaminophen which is found in Tylenol,m Datril, Anacin-3 and Panadol. It is not blood thinner. The products listed below are.  Do not take any of the  products listed below in addition to any listed on your instruction sheet.  A.P.C or A.P.C with Codeine Codeine Phosphate Capsules #3 Ibuprofen Ridaura  ABC compound Congesprin Imuran rimadil  Advil Cope Indocin Robaxisal  Alka-Seltzer Effervescent Pain Reliever and Antacid Coricidin or Coricidin-D  Indomethacin Rufen  Alka-Seltzer plus Cold Medicine Cosprin Ketoprofen S-A-C Tablets  Anacin Analgesic Tablets or Capsules Coumadin Korlgesic Salflex  Anacin Extra Strength Analgesic tablets or capsules CP-2 Tablets Lanoril Salicylate  Anaprox Cuprimine Capsules Levenox Salocol  Anexsia-D Dalteparin Magan Salsalate  Anodynos Darvon compound Magnesium Salicylate Sine-off  Ansaid Dasin Capsules Magsal Sodium Salicylate  Anturane Depen Capsules Marnal Soma  APF Arthritis pain formula Dewitt's Pills Measurin Stanback  Argesic Dia-Gesic Meclofenamic Sulfinpyrazone  Arthritis Bayer Timed Release Aspirin Diclofenac Meclomen Sulindac  Arthritis pain formula Anacin Dicumarol Medipren Supac  Analgesic (Safety coated) Arthralgen Diffunasal Mefanamic Suprofen  Arthritis Strength Bufferin Dihydrocodeine Mepro Compound Suprol  Arthropan liquid Dopirydamole Methcarbomol with Aspirin Synalgos  ASA tablets/Enseals Disalcid Micrainin Tagament  Ascriptin Doan's Midol Talwin  Ascriptin A/D Dolene Mobidin Tanderil  Ascriptin Extra Strength Dolobid Moblgesic Ticlid  Ascriptin with Codeine Doloprin or Doloprin with Codeine Momentum Tolectin  Asperbuf Duoprin Mono-gesic Trendar  Aspergum Duradyne Motrin or Motrin IB Triminicin  Aspirin plain, buffered or enteric coated Durasal Myochrisine Trigesic  Aspirin Suppositories Easprin Nalfon Trillsate  Aspirin with Codeine Ecotrin Regular or Extra Strength Naprosyn Uracel  Atromid-S Efficin Naproxen Ursinus  Auranofin  Capsules Elmiron Neocylate Vanquish  Axotal Emagrin Norgesic Verin  Azathioprine Empirin or Empirin with Codeine Normiflo Vitamin E  Azolid Emprazil  Nuprin Voltaren  Bayer Aspirin plain, buffered or children's or timed BC Tablets or powders Encaprin Orgaran Warfarin Sodium  Buff-a-Comp Enoxaparin Orudis Zorpin  Buff-a-Comp with Codeine Equegesic Os-Cal-Gesic   Buffaprin Excedrin plain, buffered or Extra Strength Oxalid   Bufferin Arthritis Strength Feldene Oxphenbutazone   Bufferin plain or Extra Strength Feldene Capsules Oxycodone with Aspirin   Bufferin with Codeine Fenoprofen Fenoprofen Pabalate or Pabalate-SF   Buffets II Flogesic Panagesic   Buffinol plain or Extra Strength Florinal or Florinal with Codeine Panwarfarin   Buf-Tabs Flurbiprofen Penicillamine   Butalbital Compound Four-way cold tablets Penicillin   Butazolidin Fragmin Pepto-Bismol   Carbenicillin Geminisyn Percodan   Carna Arthritis Reliever Geopen Persantine   Carprofen Gold's salt Persistin   Chloramphenicol Goody's Phenylbutazone   Chloromycetin Haltrain Piroxlcam   Clmetidine heparin Plaquenil   Cllnoril Hyco-pap Ponstel   Clofibrate Hydroxy chloroquine Propoxyphen         Before stopping any of these medications, be sure to consult the physician who ordered them.  Some, such as Coumadin (Warfarin) are ordered to prevent or treat serious conditions such as "deep thrombosis", "pumonary embolisms", and other heart problems.  The amount of time that you may need off of the medication may also vary with the medication and the reason for which you were taking it.  If you are taking any of these medications, please make sure you notify your pain physician before you undergo any procedures.

## 2015-09-30 NOTE — Progress Notes (Signed)
Safety precautions to be maintained throughout the outpatient stay will include: orient to surroundings, keep bed in low position, maintain call bell within reach at all times, provide assistance with transfer out of bed and ambulation.  

## 2015-10-01 ENCOUNTER — Telehealth: Payer: Self-pay

## 2015-10-01 NOTE — Telephone Encounter (Signed)
Post procedure phone call.  Patient states she is doing better than yesterday.   

## 2015-10-01 NOTE — Progress Notes (Signed)
Subjective:  Patient ID: Angela Davenport, female    DOB: Nov 02, 1960  Age: 55 y.o. MRN: [id number]  CC: Neck Pain Procedure: Trigger point injection to the left trapezius and left rhomboid 2 at each site  HPI Angela Davenport presents for reevaluation. She had a cervical epidural at her last visit approximately 2-3 weeks ago and trigger point injections to the left shoulder. For approximately 24 hours she had complete pain relief but that has resolved and she is back at her baseline today. She is quite miserable and is in obvious distress. Her medications are insufficient to keep her pain under control and she is due for a neurosurgical consultation but not for several weeks.   History Angela Davenport has a past medical history of Kidney cysts; Hot flashes; Hypothyroidism; Depression; Diabetes mellitus without complication ([place]); Osteoarthritis; Fibrocystic breast; Osteopenia; Gastritis; Hiatal hernia; History of shingles; and GERD (gastroesophageal reflux disease).   She has past surgical history that includes Tubal ligation; Wisdom tooth extraction; Knee arthroscopy (Left); Kidney cyst removal (Left); Knee arthroscopy (Left, 08/01/2013); and Medial partial knee replacement (Left).   Her family history includes Alzheimer's disease in her mother; Cancer in her father and mother; Diabetes in her father; Hypertension in her mother.She reports that she quit smoking about 32 years ago. She has never used smokeless tobacco. She reports that she drinks alcohol. She reports that she does not use illicit drugs.   ---------------------------------------------------------------------------------------------------------------------- Past Medical History  Diagnosis Date  . Kidney cysts   . Hot flashes     history of- states controlled with hormonal gel and Effexor  . Hypothyroidism   . Depression   . Diabetes mellitus without complication ([place])   . Osteoarthritis   . Fibrocystic breast   . Osteopenia   .  Gastritis   . Hiatal hernia   . History of shingles   . GERD (gastroesophageal reflux disease)     No longer has issues    Past Surgical History  Procedure Laterality Date  . Tubal ligation    . Wisdom tooth extraction    . Knee arthroscopy Left   . Kidney cyst removal Left     IV sedation  . Knee arthroscopy Left 08/01/2013    Procedure: ARTHROSCOPY LEFT KNEE WITH MEDIAL MENISCUS DEBRIDEMENT AND CHONDRPLASTY;  Surgeon: Gearlean Alf, MD;  Location: [facility];  Service: Orthopedics;  Laterality: Left;  . Medial partial knee replacement Left     Family History  Problem Relation Age of Onset  . Cancer Mother   . Alzheimer's disease Mother   . Hypertension Mother   . Cancer Father   . Diabetes Father     Steroid induced    Social History  Substance Use Topics  . Smoking status: Former Smoker -- 1.50 packs/day for 8 years    Quit date: 07/29/1983  . Smokeless tobacco: Never Used  . Alcohol Use: 0.0 oz/week    0 Standard drinks or equivalent per week     Comment: socially    ---------------------------------------------------------------------------------------------------------------------- Social History   Social History  . Marital Status: Married    Spouse Name: N/A  . Number of Children: N/A  . Years of Education: N/A   Social History Main Topics  . Smoking status: Former Smoker -- 1.50 packs/day for 8 years    Quit date: 07/29/1983  . Smokeless tobacco: Never Used  . Alcohol Use: 0.0 oz/week    0 Standard drinks or equivalent per week     Comment: socially  .  Drug Use: No  . Sexual Activity: Not Asked   Other Topics Concern  . None   Social History Narrative      ----------------------------------------------------------------------------------------------------------------------  ROS Review of Systems  Cardiovascular: Negative Pulmonary: Negative Psychological negative GU: Negative Hematologic negative   Objective:  BP 131/89 mmHg  Pulse 91   Temp(Src) 98.2 F (36.8 C) (Oral)  Resp 16  Ht 5\' 6"  (1.676 m)  Wt 175 lb (79.379 kg)  BMI 28.26 kg/m2  SpO2 100%  Physical Exam Patient is a good historian in obvious pain. Pupils are equally round reactive to light and extraocular muscles are intact Heart is regular rate and rhythm without murmur Lungs are clear to auscultation Inspection of the left trapezius reveals a trigger point 2 in the mid body and 2 trigger points in the left rhomboid and diminished strength with extension of the left arm at the elbow as compared to the right she also has a slightly diminished hand grasp on the left side compared with the right right upper extremity strength appears to be well-preserved without apparent dysfunction    Assessment & Plan:   Angela Davenport was seen today for neck pain.  Diagnoses and all orders for this visit:  Cervicalgia -     ropivacaine (PF) 2 mg/ml (0.2%) (NAROPIN) epidural 10 mL; 10 mLs by Epidural route once. -     dexamethasone (DECADRON) injection 10 mg; 1 mL (10 mg total) by Other route once. -     Ambulatory referral to Physical Therapy -     HYDROcodone-acetaminophen (NORCO/VICODIN) 5-325 MG per tablet 1 tablet; Take 1 tablet by mouth every 6 (six) hours as needed for moderate pain or severe pain. -     CERVICAL EPIDURAL STEROID INJECTION; Future  DDD (degenerative disc disease), lumbar -     Ambulatory referral to Physical Therapy -     CERVICAL EPIDURAL STEROID INJECTION; Future  Cervical radiculitis -     Ambulatory referral to Physical Therapy -     CERVICAL EPIDURAL STEROID INJECTION; Future  Other orders -     ropivacaine (PF) 2 mg/ml (0.2%) (NAROPIN) 2 MG/ML epidural;  -     dexamethasone (DECADRON) 10 MG/ML injection;  -     dexamethasone (DECADRON) 10 MG/ML injection;  -     cyclobenzaprine (FLEXERIL) 10 MG tablet; Take 1 tablet (10 mg total) by mouth at bedtime. -     HYDROcodone-acetaminophen (NORCO) 5-325 MG tablet; Take 1 tablet by mouth every 6  (six) hours as needed for moderate pain. -     Discontinue: predniSONE (STERAPRED UNI-PAK 21 TAB) 10 MG (21) TBPK tablet; Take by mouth taper from 4 doses each day to 1 dose and stop. Day 1 and 2 take 4 tablets Day 3 and 4 take 3 tablets Day 5 and 6 take 2 tablets Day 7 and 8 take 1 tablet -     predniSONE (STERAPRED UNI-PAK 21 TAB) 10 MG (21) TBPK tablet; Day 1 and 2 take 4 tablets Day 3 and 4 take 3 tablets Day 5 and 6 take 2 tablets Day 7 and 8 take 1 tablet     ----------------------------------------------------------------------------------------------------------------------  Problem List Items Addressed This Visit    None    Visit Diagnoses    Cervicalgia    -  Primary    Relevant Medications    ropivacaine (PF) 2 mg/ml (0.2%) (NAROPIN) epidural 10 mL    dexamethasone (DECADRON) injection 10 mg    Other Relevant Orders  Ambulatory referral to Physical Therapy    CERVICAL EPIDURAL STEROID INJECTION    DDD (degenerative disc disease), lumbar        Relevant Medications    dexamethasone (DECADRON) injection 10 mg    dexamethasone (DECADRON) 10 MG/ML injection (Completed)    cyclobenzaprine (FLEXERIL) 10 MG tablet    HYDROcodone-acetaminophen (NORCO) 5-325 MG tablet    predniSONE (STERAPRED UNI-PAK 21 TAB) 10 MG (21) TBPK tablet    Other Relevant Orders    Ambulatory referral to Physical Therapy    CERVICAL EPIDURAL STEROID INJECTION    Cervical radiculitis        Relevant Medications    gabapentin (NEURONTIN) 100 MG capsule    cyclobenzaprine (FLEXERIL) 10 MG tablet    Other Relevant Orders    Ambulatory referral to Physical Therapy    CERVICAL EPIDURAL STEROID INJECTION       ----------------------------------------------------------------------------------------------------------------------  1. CervicalgiaBased on her response and the nature of examination today with the obvious trigger points on evaluation regarding inject those. We'll defer on repeat cervical  epidural today with return to clinic in approximately 2 weeks to a month for possible repeat cervical epidural injection at that time. I'- TRIGGER POINT INJECTION - Ambulatory referral to Neurosurgery which will be an expedited request  2. DDD (degenerative disc disease), lumbar As per #1 -3. Cervical radiculitis  -   ----------------------------------------------------------------------------------------------------------------------  I have changed Angela Davenport's HYDROcodone-acetaminophen and predniSONE. I am also having her start on cyclobenzaprine. Additionally, I am having her maintain her levothyroxine, omeprazole, loratadine, venlafaxine XR, naproxen sodium, traMADol, PRESCRIPTION MEDICATION, methocarbamol, levothyroxine, venlafaxine XR, aspirin EC, metFORMIN, methylPREDNISolone, albuterol, azithromycin, and gabapentin. We administered ropivacaine (PF) 2 mg/ml (0.2%) and dexamethasone. We will continue to administer triamcinolone acetonide, sodium chloride flush, ropivacaine (PF) 2 mg/ml (0.2%), midazolam, lidocaine (PF), lactated ringers, iopamidol, dexamethasone, ropivacaine (PF) 2 mg/ml (0.2%), and dexamethasone.   Meds ordered this encounter  Medications  . gabapentin (NEURONTIN) 100 MG capsule    Sig: Take 100 mg by mouth 3 (three) times daily.  . ropivacaine (PF) 2 mg/ml (0.2%) (NAROPIN) epidural 10 mL    Sig:   . dexamethasone (DECADRON) injection 10 mg    Sig:   . ropivacaine (PF) 2 mg/ml (0.2%) (NAROPIN) 2 MG/ML epidural    Sig:     Sharlett Iles, Delores: cabinet override  . dexamethasone (DECADRON) 10 MG/ML injection    Sig:     Sharlett Iles, Delores: cabinet override  . dexamethasone (DECADRON) 10 MG/ML injection    Sig:     Sharlett Iles, Delores: cabinet override  . cyclobenzaprine (FLEXERIL) 10 MG tablet    Sig: Take 1 tablet (10 mg total) by mouth at bedtime.    Dispense:  30 tablet    Refill:  1  . HYDROcodone-acetaminophen (NORCO) 5-325 MG tablet    Sig: Take 1  tablet by mouth every 6 (six) hours as needed for moderate pain.    Dispense:  60 tablet    Refill:  0  . DISCONTD: predniSONE (STERAPRED UNI-PAK 21 TAB) 10 MG (21) TBPK tablet    Sig: Take by mouth taper from 4 doses each day to 1 dose and stop. Day 1 and 2 take 4 tablets Day 3 and 4 take 3 tablets Day 5 and 6 take 2 tablets Day 7 and 8 take 1 tablet    Dispense:  21 tablet    Refill:  0  . predniSONE (STERAPRED UNI-PAK 21 TAB) 10 MG (21) TBPK tablet    Sig: Day  1 and 2 take 4 tablets Day 3 and 4 take 3 tablets Day 5 and 6 take 2 tablets Day 7 and 8 take 1 tablet    Dispense:  21 tablet    Refill:  0       Follow-up: Return in about 2 weeks (around 10/14/2015) for evaluation, procedure. The trigger point overlying the left trapezius muscle was also prepped with alcohol and injected with a 25-gauge needle with 4 cc of ropivacaine 0.2% mixed with 4 mg of Decadron in a fanlike distribution and this was well-toleratedHe also injected the 2 trigger points overlying the left rhomboid she was convalesced discharged home in stable condition Molli Barrows, MD

## 2015-10-03 ENCOUNTER — Ambulatory Visit: Payer: 59 | Attending: Anesthesiology | Admitting: Physical Therapy

## 2015-10-03 DIAGNOSIS — M6281 Muscle weakness (generalized): Secondary | ICD-10-CM | POA: Insufficient documentation

## 2015-10-03 DIAGNOSIS — M436 Torticollis: Secondary | ICD-10-CM | POA: Diagnosis not present

## 2015-10-03 DIAGNOSIS — M25512 Pain in left shoulder: Secondary | ICD-10-CM | POA: Diagnosis not present

## 2015-10-03 DIAGNOSIS — M542 Cervicalgia: Secondary | ICD-10-CM | POA: Insufficient documentation

## 2015-10-03 NOTE — Therapy (Signed)
[facility] [address] [place], [address] Phone: [phone]   Fax:  [phone]  Physical Therapy Evaluation  Patient Details  Name: Angela Davenport MRN: [id number] Date of Birth: 10-22-1960 Referring Provider: Vashti Hey  Encounter Date: 10/03/2015      PT End of Session - 10/03/15 1154    Visit Number 1   Number of Visits 13   Date for PT Re-Evaluation 10/31/15   PT Start Time 1030   PT Stop Time 1130   PT Time Calculation (min) 60 min   Activity Tolerance Patient limited by pain   Behavior During Therapy [facility] for tasks assessed/performed      Past Medical History  Diagnosis Date  . Kidney cysts   . Hot flashes     history of- states controlled with hormonal gel and Effexor  . Hypothyroidism   . Depression   . Diabetes mellitus without complication ([place])   . Osteoarthritis   . Fibrocystic breast   . Osteopenia   . Gastritis   . Hiatal hernia   . History of shingles   . GERD (gastroesophageal reflux disease)     No longer has issues    Past Surgical History  Procedure Laterality Date  . Tubal ligation    . Wisdom tooth extraction    . Knee arthroscopy Left   . Kidney cyst removal Left     IV sedation  . Knee arthroscopy Left 08/01/2013    Procedure: ARTHROSCOPY LEFT KNEE WITH MEDIAL MENISCUS DEBRIDEMENT AND CHONDRPLASTY;  Surgeon: Gearlean Alf, MD;  Location: [facility];  Service: Orthopedics;  Laterality: Left;  . Medial partial knee replacement Left     There were no vitals filed for this visit.       Subjective Assessment - 10/03/15 1138    Subjective Pt c/o L sided neck, shoulder and arm pain   Pertinent History Pain began 3-4 months ago idiopathically upon waking. Pain is described as a burning pain, with little to no relief except medication, trigger point injections. 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 Pt  reports N/T additionally.   Limitations Sitting;Reading;Lifting;Standing;Walking   Diagnostic tests x-ray - pt has decr. joint space from C5-C7   Patient Stated Goals decr pain.   Currently in Pain? Yes   Pain Score 5    Pain Location Neck   Pain Orientation Left   Pain Descriptors / Indicators Constant;Pins and needles;Shooting   Pain Type Chronic pain            [facility] PT Assessment - 10/03/15 0001    Assessment   Medical Diagnosis cervicalgia, DDD - lumbar, cervical radiculitis   Referring Provider Vashti Hey   Hand Dominance Right   Prior Therapy none for this   Precautions   Precautions None   Restrictions   Weight Bearing Restrictions No   Balance Screen   Has the patient fallen in the past 6 months No   Has the patient had a decrease in activity level because of a fear of falling?  No   Is the patient reluctant to leave their home because of a fear of falling?  No   Prior Function   Level of Independence Independent   Vocation Full time employment   Chief Technology Officer, lifting, moving heavy objects, working at [employer] reading, walking  Sensation   Light Touch Impaired Detail   Light Touch Impaired Details Impaired LUE   Additional Comments C7 nerve distribution    Posture/Postural Control   Posture Comments head tilted to R, unable to sit greater than 5 min due to pain, prefers radial nerve/C7 position of comfort of arm overhead with elbow flexed.   ROM / Strength   AROM / PROM / Strength AROM;Strength   AROM   Overall AROM Comments WNL, pain with AROM on L side.   Strength   Overall Strength Comments WNL except L wrist flex, elbow ext 3/5, hand grip 30# on L, 60 # on R.   Palpation   Palpation comment hypertonic paraspinals at C5-C7, pain with palpation of UT and LS on L.           Objective: Manual traction x8  Min total. Pt reported significant improvement with this.  Compression and STM performed on L UT, LS. Improvement with  this.  Rib mobs 3x1 min on 1st rib.   Educated pt on avoiding ice, use of heat, avoiding painful positioning/traction on arm.  Following session pain improved 2/10.                PT Education - 10/03/15 1152    Education provided Yes   Education Details progression of PT   Person(s) Educated Patient   Methods Explanation   Comprehension Verbalized understanding             PT Long Term Goals - 10/03/15 1159    PT LONG TERM GOAL #1   Title Pt will have improved grip strength indicating improvement in overall cervicogenic weakness as seen by L grip of 40#   Baseline 30#   Time 4   Period Weeks   Status New   PT LONG TERM GOAL #2   Title Pt will demonstrate decr. 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 Pain appears to be related to C7 radiculopathy as pt is impaired in this sensory field, demonstrates focal weakness of C7 innervated musculature. Pt prefers position of comfort typically seen with radial nerve pain but based on pain with palpation is more consonant with nerve root impingement. Pt responded well during eval to traction and STM - will continue to address this in future appointments.    Rehab Potential Good   Clinical Impairments Affecting Rehab Potential chronic pain, degree of pain   PT Frequency 3x / week   PT Duration 4 weeks   PT Treatment/Interventions Aquatic Therapy;Dry needling;Therapeutic exercise;Manual techniques;ADLs/Self Care Home Management;Patient/family education   PT Next Visit Plan dry needling, STM, traction   Consulted and Agree with Plan of Care Patient      Patient will benefit from skilled  therapeutic intervention in order to improve the following deficits and impairments:  Pain, Decreased range of motion, Improper body mechanics, Hypomobility, Impaired sensation, Decreased strength, Dizziness  Visit Diagnosis: Cervicalgia - Plan: PT plan of care cert/re-cert  Muscle weakness (generalized) -  Plan: PT plan of care cert/re-cert  Stiffness of cervical spine - Plan: PT plan of care cert/re-cert     Problem List Patient Active Problem List   Diagnosis Date Noted  . Diabetes mellitus, type 2 ([place]) 08/06/2014  . Acute medial meniscal tear 08/01/2013    Kiko Ripp PT DPT 10/03/2015, 12:06 PM  [facility] [address] [place], [address] Phone: [phone]   Fax:  [phone]  Name: CHAYE SIDES MRN: [id number] Date of Birth: May 13, 1961

## 2015-10-07 ENCOUNTER — Ambulatory Visit: Payer: 59 | Admitting: Physical Therapy

## 2015-10-07 DIAGNOSIS — M436 Torticollis: Secondary | ICD-10-CM | POA: Diagnosis not present

## 2015-10-07 DIAGNOSIS — M542 Cervicalgia: Secondary | ICD-10-CM | POA: Diagnosis not present

## 2015-10-07 DIAGNOSIS — M25512 Pain in left shoulder: Secondary | ICD-10-CM | POA: Diagnosis not present

## 2015-10-07 DIAGNOSIS — M6281 Muscle weakness (generalized): Secondary | ICD-10-CM

## 2015-10-07 NOTE — Therapy (Signed)
[facility] [address], [place], [address] Phone: [phone]   Fax:  [phone]  Physical Therapy Treatment  Patient Details  Name: Angela Davenport MRN: [id number] Date of Birth: [birth year] Referring Provider: Vashti Hey  Encounter Date: 10/07/2015      PT End of Session - 10/07/15 0854    Visit Number 2   Number of Visits 13   Date for PT Re-Evaluation 10/31/15   PT Start Time 0815   PT Stop Time [id number]   PT Time Calculation (min) 39 min   Activity Tolerance Patient limited by pain   Behavior During Therapy [facility] for tasks assessed/performed      Past Medical History  Diagnosis Date  . Kidney cysts   . Hot flashes     history of- states controlled with hormonal gel and Effexor  . Hypothyroidism   . Depression   . Diabetes mellitus without complication ([place])   . Osteoarthritis   . Fibrocystic breast   . Osteopenia   . Gastritis   . Hiatal hernia   . History of shingles   . GERD (gastroesophageal reflux disease)     No longer has issues    Past Surgical History  Procedure Laterality Date  . Tubal ligation    . Wisdom tooth extraction    . Knee arthroscopy Left   . Kidney cyst removal Left     IV sedation  . Knee arthroscopy Left 08/01/2013    Procedure: ARTHROSCOPY LEFT KNEE WITH MEDIAL MENISCUS DEBRIDEMENT AND CHONDRPLASTY;  Surgeon: Gearlean Alf, MD;  Location: [facility];  Service: Orthopedics;  Laterality: Left;  . Medial partial knee replacement Left     There were no vitals filed for this visit.      Subjective Assessment - 10/07/15 0822    Subjective Pt reports she is improving considerably. decr. numbness, still present in middle finger posteriorly and up into arm.    Currently in Pain? Yes   Pain Score --  tension            Objective: Extensive STM performed on L periscapular muscles, UT, LS. Utilizing C-R, ischemic compression to minimize pain. Several severe  TrP noted in LS which improved with compression holds.  STM performed on ECRB, ECRL, common extensor tendon, one severe TrP noted in this region. Avoided bruising to minimize re-irritation.  TDN performed on ECRB, ECRL, deltoid. Pt had difficulty tolerating dry needling and reported some parasthesia incr. With needling. Following this parasthesia improved.  Educated pt on positioning, avoiding arm traction.  Performed cervical retractions, scapular retractions to allow for light activation of irritated tissue. Pt required cuing with scapular retractions to minimize shoulder shrug.                      PT Education - 10/07/15 0854    Education provided Yes   Education Details scapular retractions, cervical retractions   Person(s) Educated Patient   Methods Explanation   Comprehension Verbalized understanding             PT Long Term Goals - 10/03/15 1159    PT LONG TERM GOAL #1   Title Pt will have improved grip strength indicating improvement in overall cervicogenic weakness as seen by L grip of 40#   Baseline 30#   Time 4   Period Weeks   Status New   PT LONG TERM GOAL #2   Title Pt will demonstrate decr.  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 cervical lateral flexion at rest, decr. need to stay in position of comfort. Also noted significant bruising in L posterior arm where pt reports she has been using her TENS unit. PT encouraged pt to minimize use of this. Pt is performing traction at home so deferred this to address muscle tightness. Pt continues to c/o pain with palpation, pain at rest.   Rehab  Potential Good   Clinical Impairments Affecting Rehab Potential chronic pain, degree of pain   PT Frequency 3x / week   PT Duration 4 weeks   PT Treatment/Interventions Aquatic Therapy;Dry needling;Therapeutic exercise;Manual techniques;ADLs/Self Care Home Management;Patient/family education   PT Next Visit Plan dry needling, STM, traction   Consulted and Agree with Plan of Care Patient      Patient will benefit from skilled therapeutic intervention in order to improve the following deficits and impairments:  Pain, Decreased range of motion, Improper body mechanics, Hypomobility, Impaired sensation, Decreased strength, Dizziness  Visit Diagnosis: Cervicalgia  Muscle weakness (generalized)     Problem List Patient Active Problem List   Diagnosis Date Noted  . Diabetes mellitus, type 2 ([place]) 08/06/2014  . Acute medial meniscal tear 08/01/2013    Fisher,Benjamin PT DPT 10/07/2015, 8:58 AM  [facility] [address], [place], [address] Phone: [phone]   Fax:  [phone]  Name: Angela Davenport MRN: [id number] Date of Birth: 01-21-1961

## 2015-10-09 ENCOUNTER — Ambulatory Visit: Payer: Self-pay | Admitting: Anesthesiology

## 2015-10-09 ENCOUNTER — Ambulatory Visit: Payer: 59 | Admitting: Physical Therapy

## 2015-10-09 DIAGNOSIS — M542 Cervicalgia: Secondary | ICD-10-CM

## 2015-10-10 ENCOUNTER — Ambulatory Visit: Payer: 59 | Admitting: Physical Therapy

## 2015-10-10 DIAGNOSIS — M436 Torticollis: Secondary | ICD-10-CM

## 2015-10-10 DIAGNOSIS — M6281 Muscle weakness (generalized): Secondary | ICD-10-CM | POA: Diagnosis not present

## 2015-10-10 DIAGNOSIS — M25512 Pain in left shoulder: Secondary | ICD-10-CM | POA: Diagnosis not present

## 2015-10-10 DIAGNOSIS — M542 Cervicalgia: Secondary | ICD-10-CM | POA: Diagnosis not present

## 2015-10-10 NOTE — Therapy (Signed)
[facility] [address] [place], [address] Phone: [phone]   Fax:  [phone]  Physical Therapy Treatment  Patient Details  Name: Angela Davenport MRN: [id number] Date of Birth: December 18, 1960 Referring Provider: Vashti Hey  Encounter Date: 10/10/2015      PT End of Session - 10/10/15 1230    Visit Number 3   Number of Visits 13   Date for PT Re-Evaluation 10/31/15   PT Start Time [id number]   PT Stop Time 1231   PT Time Calculation (min) 46 min   Activity Tolerance Patient limited by pain   Behavior During Therapy [facility] for tasks assessed/performed      Past Medical History  Diagnosis Date  . Kidney cysts   . Hot flashes     history of- states controlled with hormonal gel and Effexor  . Hypothyroidism   . Depression   . Diabetes mellitus without complication ([place])   . Osteoarthritis   . Fibrocystic breast   . Osteopenia   . Gastritis   . Hiatal hernia   . History of shingles   . GERD (gastroesophageal reflux disease)     No longer has issues    Past Surgical History  Procedure Laterality Date  . Tubal ligation    . Wisdom tooth extraction    . Knee arthroscopy Left   . Kidney cyst removal Left     IV sedation  . Knee arthroscopy Left 08/01/2013    Procedure: ARTHROSCOPY LEFT KNEE WITH MEDIAL MENISCUS DEBRIDEMENT AND CHONDRPLASTY;  Surgeon: Gearlean Alf, MD;  Location: [facility];  Service: Orthopedics;  Laterality: Left;  . Medial partial knee replacement Left     There were no vitals filed for this visit.      Subjective Assessment - 10/10/15 1229    Subjective Continued improvement in neck pain. Pt only has N/T in hand currently.   Currently in Pain? No/denies               Objective: Extensive STM performed in seated. Petrissage, compression and oscillatory mob of soft tissue performed on UT, LS, deltoid, ECRL and ECRB. Worst trigger points found in LS, ECRL. Following  this pt reported incr. N/T in hand.  Performed x5 min total light cervical traction oscillatory with suboccipital release.  Following session pt reported feeling "just about normal" however continues to have definite weakness and N/T.                       PT Long Term Goals - 10/03/15 1159    PT LONG TERM GOAL #1   Title Pt will have improved grip strength indicating improvement in overall cervicogenic weakness as seen by L grip of 40#   Baseline 30#   Time 4   Period Weeks   Status New   PT LONG TERM GOAL #2   Title Pt will demonstrate decr. 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 as focus of session yesterday was on strengthening focus today was on continuing to decr. pain and irritation in neck and UE. Pt does continue to have pain, weakness and impaired motor function on L side.   Rehab Potential Good   Clinical Impairments Affecting Rehab Potential chronic pain, degree of pain   PT Frequency 3x / week   PT Duration 4 weeks   PT Treatment/Interventions Aquatic Therapy;Dry needling;Therapeutic exercise;Manual techniques;ADLs/Self Care Home Management;Patient/family education   PT Next Visit Plan dry needling, STM, traction   Consulted and Agree with Plan of Care Patient      Patient will benefit from skilled therapeutic intervention in order to improve the following deficits and impairments:  Pain, Decreased range of motion, Improper body mechanics, Hypomobility, Impaired sensation, Decreased strength, Dizziness  Visit Diagnosis: Stiffness of cervical spine  Pain in left shoulder     Problem  List Patient Active Problem List   Diagnosis Date Noted  . Diabetes mellitus, type 2 ([place]) 08/06/2014  . Acute medial meniscal tear 08/01/2013    Fisher,Benjamin PT DPT 10/10/2015, 1:19 PM  [place] [facility] [address], [place], [address] Phone: [phone]   Fax:  [phone]  Name: Angela Davenport MRN: [id number] Date of Birth: July 12, 1960

## 2015-10-14 ENCOUNTER — Ambulatory Visit: Payer: 59

## 2015-10-14 ENCOUNTER — Ambulatory Visit: Payer: 59 | Attending: Anesthesiology | Admitting: Anesthesiology

## 2015-10-14 ENCOUNTER — Encounter: Payer: Self-pay | Admitting: Anesthesiology

## 2015-10-14 VITALS — BP 122/82 | HR 99 | Temp 98.6°F | Resp 16 | Ht 66.0 in | Wt 175.0 lb

## 2015-10-14 DIAGNOSIS — M542 Cervicalgia: Secondary | ICD-10-CM | POA: Diagnosis not present

## 2015-10-14 DIAGNOSIS — Z96652 Presence of left artificial knee joint: Secondary | ICD-10-CM | POA: Diagnosis not present

## 2015-10-14 DIAGNOSIS — E039 Hypothyroidism, unspecified: Secondary | ICD-10-CM | POA: Diagnosis not present

## 2015-10-14 DIAGNOSIS — M8580 Other specified disorders of bone density and structure, unspecified site: Secondary | ICD-10-CM | POA: Diagnosis not present

## 2015-10-14 DIAGNOSIS — K449 Diaphragmatic hernia without obstruction or gangrene: Secondary | ICD-10-CM | POA: Diagnosis not present

## 2015-10-14 DIAGNOSIS — M6281 Muscle weakness (generalized): Secondary | ICD-10-CM | POA: Diagnosis not present

## 2015-10-14 DIAGNOSIS — K219 Gastro-esophageal reflux disease without esophagitis: Secondary | ICD-10-CM | POA: Insufficient documentation

## 2015-10-14 DIAGNOSIS — M5136 Other intervertebral disc degeneration, lumbar region: Secondary | ICD-10-CM | POA: Insufficient documentation

## 2015-10-14 DIAGNOSIS — N951 Menopausal and female climacteric states: Secondary | ICD-10-CM | POA: Insufficient documentation

## 2015-10-14 DIAGNOSIS — M25519 Pain in unspecified shoulder: Secondary | ICD-10-CM | POA: Diagnosis present

## 2015-10-14 DIAGNOSIS — M51369 Other intervertebral disc degeneration, lumbar region without mention of lumbar back pain or lower extremity pain: Secondary | ICD-10-CM

## 2015-10-14 DIAGNOSIS — F329 Major depressive disorder, single episode, unspecified: Secondary | ICD-10-CM | POA: Insufficient documentation

## 2015-10-14 DIAGNOSIS — E119 Type 2 diabetes mellitus without complications: Secondary | ICD-10-CM | POA: Insufficient documentation

## 2015-10-14 DIAGNOSIS — Z87891 Personal history of nicotine dependence: Secondary | ICD-10-CM | POA: Diagnosis not present

## 2015-10-14 DIAGNOSIS — M5412 Radiculopathy, cervical region: Secondary | ICD-10-CM

## 2015-10-14 DIAGNOSIS — M25512 Pain in left shoulder: Secondary | ICD-10-CM

## 2015-10-14 DIAGNOSIS — M436 Torticollis: Secondary | ICD-10-CM | POA: Diagnosis not present

## 2015-10-14 MED ORDER — DEXAMETHASONE SODIUM PHOSPHATE 10 MG/ML IJ SOLN
10.0000 mg | Freq: Once | INTRAMUSCULAR | Status: DC
  Filled 2015-10-14: qty 1

## 2015-10-14 MED ORDER — LIDOCAINE HCL (PF) 1 % IJ SOLN
5.0000 mL | Freq: Once | INTRAMUSCULAR | Status: DC
  Filled 2015-10-14: qty 5

## 2015-10-14 MED ORDER — LIDOCAINE HCL (PF) 1 % IJ SOLN
INTRAMUSCULAR | Status: AC
  Filled 2015-10-14: qty 5

## 2015-10-14 NOTE — Progress Notes (Signed)
Safety precautions to be maintained throughout the outpatient stay will include: orient to surroundings, keep bed in low position, maintain call bell within reach at all times, provide assistance with transfer out of bed and ambulation.  

## 2015-10-14 NOTE — Patient Instructions (Addendum)
Epidural Steroid Injection Patient Information  Description: The epidural space surrounds the nerves as they exit the spinal cord.  In some patients, the nerves can be compressed and inflamed by a bulging disc or a tight spinal canal (spinal stenosis).  By injecting steroids into the epidural space, we can bring irritated nerves into direct contact with a potentially helpful medication.  These steroids act directly on the irritated nerves and can reduce swelling and inflammation which often leads to decreased pain.  Epidural steroids may be injected anywhere along the spine and from the neck to the low back depending upon the location of your pain.   After numbing the skin with local anesthetic (like Novocaine), a small needle is passed into the epidural space slowly.  You may experience a sensation of pressure while this is being done.  The entire block usually last less than 10 minutes.  Conditions which may be treated by epidural steroids:   Low back and leg pain  Neck and arm pain  Spinal stenosis  Post-laminectomy syndrome  Herpes zoster (shingles) pain  Pain from compression fractures  Preparation for the injection:  1. Do not eat any solid food or dairy products within 8 hours of your appointment.  2. You may drink clear liquids up to 3 hours before appointment.  Clear liquids include water, black coffee, juice or soda.  No milk or cream please. 3. You may take your regular medication, including pain medications, with a sip of water before your appointment  Diabetics should hold regular insulin (if taken separately) and take 1/2 normal NPH dos the morning of the procedure.  Carry some sugar containing items with you to your appointment. 4. A driver must accompany you and be prepared to drive you home after your procedure.  5. Bring all your current medications with your. 6. An IV may be inserted and sedation may be given at the discretion of the physician.   7. A blood pressure  cuff, EKG and other monitors will often be applied during the procedure.  Some patients may need to have extra oxygen administered for a short period. 8. You will be asked to provide medical information, including your allergies, prior to the procedure.  We must know immediately if you are taking blood thinners (like Coumadin/Warfarin)  Or if you are allergic to IV iodine contrast (dye). We must know if you could possible be pregnant.  Possible side-effects:  Bleeding from needle site  Infection (rare, may require surgery)  Nerve injury (rare)  Numbness & tingling (temporary)  Difficulty urinating (rare, temporary)  Spinal headache ( a headache worse with upright posture)  Light -headedness (temporary)  Pain at injection site (several days)  Decreased blood pressure (temporary)  Weakness in arm/leg (temporary)  Pressure sensation in back/neck (temporary)  Call if you experience:  Fever/chills associated with headache or increased back/neck pain.  Headache worsened by an upright position.  New onset weakness or numbness of an extremity below the injection site  Hives or difficulty breathing (go to the emergency room)  Inflammation or drainage at the infection site  Severe back/neck pain  Any new symptoms which are concerning to you  Please note:  Although the local anesthetic injected can often make your back or neck feel good for several hours after the injection, the pain will likely return.  It takes 3-7 days for steroids to work in the epidural space.  You may not notice any pain relief for at least that one week.    If effective, we will often do a series of three injections spaced 3-6 weeks apart to maximally decrease your pain.  After the initial series, we generally will wait several months before considering a repeat injection of the same type.  If you have any questions, please call [phone] [facility] Clinic[facility] R[facility]KS AND  COMPLICATIONS  What are the risk, side effects and possible complications? Generally speaking, most procedures are safe.  However, with any procedure there are risks, side effects, and the possibility of complications.  The risks and complications are dependent upon the sites that are lesioned, or the type of nerve block to be performed.  The closer the procedure is to the spine, the more serious the risks are.  Great care is taken when placing the radio frequency needles, block needles or lesioning probes, but sometimes complications can occur. 1. Infection: Any time there is an injection through the skin, there is a risk of infection.  This is why sterile conditions are used for these blocks.  There are four possible types of infection. 1. Localized skin infection. 2. Central Nervous System Infection-This can be in the form of Meningitis, which can be deadly. 3. Epidural Infections-This can be in the form of an epidural abscess, which can cause pressure inside of the spine, causing compression of the spinal cord with subsequent paralysis. This would require an emergency surgery to decompress, and there are no guarantees that the patient would recover from the paralysis. 4. Discitis-This is an infection of the intervertebral discs.  It occurs in about 1% of discography procedures.  It is difficult to treat and it may lead to surgery.        2. Pain: the needles have to go through skin and soft tissues, will cause soreness.       3. Damage to internal structures:  The nerves to be lesioned may be near blood vessels or    other nerves which can be potentially damaged.       4. Bleeding: Bleeding is more common if the patient is taking blood thinners such as  aspirin, Coumadin, Ticiid, Plavix, etc., or if he/she have some genetic predisposition  such as hemophilia. Bleeding into the spinal canal can cause compression of the spinal  cord with subsequent paralysis.  This would require an emergency surgery  to  decompress and there are no guarantees that the patient would recover from the  paralysis.       5. Pneumothorax:  Puncturing of a lung is a possibility, every time a needle is introduced in  the area of the chest or upper back.  Pneumothorax refers to free air around the  collapsed lung(s), inside of the thoracic cavity (chest cavity).  Another two possible  complications related to a similar event would include: Hemothorax and Chylothorax.   These are variations of the Pneumothorax, where instead of air around the collapsed  lung(s), you may have blood or chyle, respectively.       6. Spinal headaches: They may occur with any procedures in the area of the spine.       7. Persistent CSF (Cerebro-Spinal Fluid) leakage: This is a rare problem, but may occur  with prolonged intrathecal or epidural catheters either due to the formation of a fistulous  track or a dural tear.       8. Nerve damage: By working so close to the spinal cord, there is always a possibility of  nerve damage, which could be   as serious as a permanent spinal cord injury with  paralysis.       9. Death:  Although rare, severe deadly allergic reactions known as "Anaphylactic  reaction" can occur to any of the medications used.      10. Worsening of the symptoms:  We can always make thing worse.  What are the chances of something like this happening? Chances of any of this occuring are extremely low.  By statistics, you have more of a chance of getting killed in a motor vehicle accident: while driving to the hospital than any of the above occurring .  Nevertheless, you should be aware that they are possibilities.  In general, it is similar to taking a shower.  Everybody knows that you can slip, hit your head and get killed.  Does that mean that you should not shower again?  Nevertheless always keep in mind that statistics do not mean anything if you happen to be on the wrong side of them.  Even if a procedure has a 1 (one) in a 1,000,000  (million) chance of going wrong, it you happen to be that one..Also, keep in mind that by statistics, you have more of a chance of having something go wrong when taking medications.  Who should not have this procedure? If you are on a blood thinning medication (e.g. Coumadin, Plavix, see list of "Blood Thinners"), or if you have an active infection going on, you should not have the procedure.  If you are taking any blood thinners, please inform your physician.  How should I prepare for this procedure?  Do not eat or drink anything at least six hours prior to the procedure.  Bring a driver with you .  It cannot be a taxi.  Come accompanied by an adult that can drive you back, and that is strong enough to help you if your legs get weak or numb from the local anesthetic.  Take all of your medicines the morning of the procedure with just enough water to swallow them.  If you have diabetes, make sure that you are scheduled to have your procedure done first thing in the morning, whenever possible.  If you have diabetes, take only half of your insulin dose and notify our nurse that you have done so as soon as you arrive at the clinic.  If you are diabetic, but only take blood sugar pills (oral hypoglycemic), then do not take them on the morning of your procedure.  You may take them after you have had the procedure.  Do not take aspirin or any aspirin-containing medications, at least eleven (11) days prior to the procedure.  They may prolong bleeding.  Wear loose fitting clothing that may be easy to take off and that you would not mind if it got stained with Betadine or blood.  Do not wear any jewelry or perfume  Remove any nail coloring.  It will interfere with some of our monitoring equipment.  NOTE: Remember that this is not meant to be interpreted as a complete list of all possible complications.  Unforeseen problems may occur.  BLOOD THINNERS The following drugs contain aspirin or other  products, which can cause increased bleeding during surgery and should not be taken for 2 weeks prior to and 1 week after surgery.  If you should need take something for relief of minor pain, you may take acetaminophen which is found in Tylenol,m Datril, Anacin-3 and Panadol. It is not blood thinner. The products listed below   are.  Do not take any of the products listed below in addition to any listed on your instruction sheet.  A.P.C or A.P.C with Codeine Codeine Phosphate Capsules #3 Ibuprofen Ridaura  ABC compound Congesprin Imuran rimadil  Advil Cope Indocin Robaxisal  Alka-Seltzer Effervescent Pain Reliever and Antacid Coricidin or Coricidin-D  Indomethacin Rufen  Alka-Seltzer plus Cold Medicine Cosprin Ketoprofen S-A-C Tablets  Anacin Analgesic Tablets or Capsules Coumadin Korlgesic Salflex  Anacin Extra Strength Analgesic tablets or capsules CP-2 Tablets Lanoril Salicylate  Anaprox Cuprimine Capsules Levenox Salocol  Anexsia-D Dalteparin Magan Salsalate  Anodynos Darvon compound Magnesium Salicylate Sine-off  Ansaid Dasin Capsules Magsal Sodium Salicylate  Anturane Depen Capsules Marnal Soma  APF Arthritis pain formula Dewitt's Pills Measurin Stanback  Argesic Dia-Gesic Meclofenamic Sulfinpyrazone  Arthritis Bayer Timed Release Aspirin Diclofenac Meclomen Sulindac  Arthritis pain formula Anacin Dicumarol Medipren Supac  Analgesic (Safety coated) Arthralgen Diffunasal Mefanamic Suprofen  Arthritis Strength Bufferin Dihydrocodeine Mepro Compound Suprol  Arthropan liquid Dopirydamole Methcarbomol with Aspirin Synalgos  ASA tablets/Enseals Disalcid Micrainin Tagament  Ascriptin Doan's Midol Talwin  Ascriptin A/D Dolene Mobidin Tanderil  Ascriptin Extra Strength Dolobid Moblgesic Ticlid  Ascriptin with Codeine Doloprin or Doloprin with Codeine Momentum Tolectin  Asperbuf Duoprin Mono-gesic Trendar  Aspergum Duradyne Motrin or Motrin IB Triminicin  Aspirin plain, buffered or enteric  coated Durasal Myochrisine Trigesic  Aspirin Suppositories Easprin Nalfon Trillsate  Aspirin with Codeine Ecotrin Regular or Extra Strength Naprosyn Uracel  Atromid-S Efficin Naproxen Ursinus  Auranofin Capsules Elmiron Neocylate Vanquish  Axotal Emagrin Norgesic Verin  Azathioprine Empirin or Empirin with Codeine Normiflo Vitamin E  Azolid Emprazil Nuprin Voltaren  Bayer Aspirin plain, buffered or children's or timed BC Tablets or powders Encaprin Orgaran Warfarin Sodium  Buff-a-Comp Enoxaparin Orudis Zorpin  Buff-a-Comp with Codeine Equegesic Os-Cal-Gesic   Buffaprin Excedrin plain, buffered or Extra Strength Oxalid   Bufferin Arthritis Strength Feldene Oxphenbutazone   Bufferin plain or Extra Strength Feldene Capsules Oxycodone with Aspirin   Bufferin with Codeine Fenoprofen Fenoprofen Pabalate or Pabalate-SF   Buffets II Flogesic Panagesic   Buffinol plain or Extra Strength Florinal or Florinal with Codeine Panwarfarin   Buf-Tabs Flurbiprofen Penicillamine   Butalbital Compound Four-way cold tablets Penicillin   Butazolidin Fragmin Pepto-Bismol   Carbenicillin Geminisyn Percodan   Carna Arthritis Reliever Geopen Persantine   Carprofen Gold's salt Persistin   Chloramphenicol Goody's Phenylbutazone   Chloromycetin Haltrain Piroxlcam   Clmetidine heparin Plaquenil   Cllnoril Hyco-pap Ponstel   Clofibrate Hydroxy chloroquine Propoxyphen         Before stopping any of these medications, be sure to consult the physician who ordered them.  Some, such as Coumadin (Warfarin) are ordered to prevent or treat serious conditions such as "deep thrombosis", "pumonary embolisms", and other heart problems.  The amount of time that you may need off of the medication may also vary with the medication and the reason for which you were taking it.  If you are taking any of these medications, please make sure you notify your pain physician before you undergo any procedures.         Pain  Management Discharge Instructions  General Discharge Instructions :  If you need to reach your doctor call: Monday-Friday 8:00 am - 4:00 pm at [phone] or toll free [phone].  After clinic hours [phone] to have operator reach doctor.  Bring all of your medication bottles to all your appointments in the pain clinic.  To cancel or reschedule your appointment with Pain Management please remember   to call 24 hours in advance to avoid a fee.  Refer to the educational materials which you have been given on: General Risks, I had my Procedure. Discharge Instructions, Post Sedation.  Post Procedure Instructions:  The drugs you were given will stay in your system until tomorrow, so for the next 24 hours you should not drive, make any legal decisions or drink any alcoholic beverages.  You may eat anything you prefer, but it is better to start with liquids then soups and crackers, and gradually work up to solid foods.  Please notify your doctor immediately if you have any unusual bleeding, trouble breathing or pain that is not related to your normal pain.  Depending on the type of procedure that was done, some parts of your body may feel week and/or numb.  This usually clears up by tonight or the next day.  Walk with the use of an assistive device or accompanied by an adult for the 24 hours.  You may use ice on the affected area for the first 24 hours.  Put ice in a Ziploc bag and cover with a towel and place against area 15 minutes on 15 minutes off.  You may switch to heat after 24 hours. 

## 2015-10-14 NOTE — Therapy (Signed)
[facility] [address], [place], [address] Phone: [phone]   Fax:  [phone]  Physical Therapy Treatment  Patient Details  Name: Angela Davenport MRN: [id number] Date of Birth: September 07, 1960 Referring Provider: Vashti Hey  Encounter Date: 10/14/2015      PT End of Session - 10/14/15 1219    Visit Number 4   Number of Visits 13   Date for PT Re-Evaluation 10/31/15   PT Start Time 0955   PT Stop Time 1033   PT Time Calculation (min) 38 min   Activity Tolerance Patient limited by pain   Behavior During Therapy [facility] for tasks assessed/performed      Past Medical History  Diagnosis Date  . Kidney cysts   . Hot flashes     history of- states controlled with hormonal gel and Effexor  . Hypothyroidism   . Depression   . Diabetes mellitus without complication ([place])   . Osteoarthritis   . Fibrocystic breast   . Osteopenia   . Gastritis   . Hiatal hernia   . History of shingles   . GERD (gastroesophageal reflux disease)     No longer has issues    Past Surgical History  Procedure Laterality Date  . Tubal ligation    . Wisdom tooth extraction    . Knee arthroscopy Left   . Kidney cyst removal Left     IV sedation  . Knee arthroscopy Left 08/01/2013    Procedure: ARTHROSCOPY LEFT KNEE WITH MEDIAL MENISCUS DEBRIDEMENT AND CHONDRPLASTY;  Surgeon: Gearlean Alf, MD;  Location: [facility];  Service: Orthopedics;  Laterality: Left;  . Medial partial knee replacement Left     There were no vitals filed for this visit.      Subjective Assessment - 10/14/15 1214    Subjective Pt reports she has noticed significant improvement in neck pain since starting therapy. She believes that she has somewhat plateaued in her progress but believes that she will continue to see improvement with continued therapy. Pt has a follow-up appointment with pain medicine MD and is unsure whether they may try repeat injections.  No specific questions or concerns at this time.    Pertinent History Pain began 3-4 months ago idiopathically upon waking. Pain is described as a burning pain, with little to no relief except medication, trigger point injections. 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 Pt reports N/T additionally.   Limitations Sitting;Reading;Lifting;Standing;Walking   Diagnostic tests x-ray - pt has decr. joint space from C5-C7   Patient Stated Goals decr pain.   Currently in Pain? Yes   Pain Score 3    Pain Location Neck   Pain Orientation Left   Pain Descriptors / Indicators Pins and needles   Pain Type Chronic pain   Pain Onset More than a month ago   Pain Frequency Constant       TREATMENT  Manual Therapy Intermittent cervical distraction with 20 second hold and 20 second rest x 8 repetitions; Suboccipital release 30 seconds x 5; Grade I CPA to C6 and C7 30 seconds/bout x 3 bouts; Grade I L UPA at C6 and C7 30 seconds/bout x 3 bouts; Extensive STM performed in prone. Petrissage, compression and oscillatory mob of soft tissue performed on UT, low trap, and cervical extensors; Pt reports no increase in numbness/tingling in L hand with  STM; Grade III first rib inferior mobilizations L side 20 seconds x 3;                                        PT Education - 10/14/15 1219    Education provided Yes   Education Details Reinforced HEP and plan of care   Person(s) Educated Patient   Methods Explanation   Comprehension Verbalized understanding             PT Long Term Goals - 10/03/15 1159    PT LONG TERM GOAL #1   Title Pt will have improved grip strength indicating improvement in overall cervicogenic weakness as seen by L grip of 40#   Baseline 30#   Time 4   Period Weeks   Status New   PT LONG TERM GOAL #2   Title Pt will demonstrate decr. N/T in C7 nerve field to improve protective  response   Baseline impaired   Time 4   Period Weeks   Status New   PT LONG TERM GOAL #3   Title Pt will be pain free to be able to return to work as an Therapist, sports   Baseline 5/10 pain   Time 4   Period Weeks   Status New               Plan - 10/14/15 1220    Clinical Impression Statement Pt reports positive reproduction of pain with cervical distraction, low grade mobilizations, and STM. However she only reports 1 point decrease on NPRS which is not clinically significant. Pt reports 2/10 neck pain at end of session. Pt encouraged to continue HEP and follow-up as scheduled.    Rehab Potential Good   Clinical Impairments Affecting Rehab Potential chronic pain, degree of pain   PT Frequency 3x / week   PT Duration 4 weeks   PT Treatment/Interventions Aquatic Therapy;Dry needling;Therapeutic exercise;Manual techniques;ADLs/Self Care Home Management;Patient/family education   PT Next Visit Plan dry needling, STM, traction   PT Home Exercise Plan Continue as prescribed   Consulted and Agree with Plan of Care Patient      Patient will benefit from skilled therapeutic intervention in order to improve the following deficits and impairments:  Pain, Decreased range of motion, Improper body mechanics, Hypomobility, Impaired sensation, Decreased strength, Dizziness  Visit Diagnosis: Cervicalgia  Pain in left shoulder     Problem List Patient Active Problem List   Diagnosis Date Noted  . Diabetes mellitus, type 2 ([place]) 08/06/2014  . Acute medial meniscal tear 08/01/2013   Phillips Grout PT, DPT   Huprich,Jason 10/14/2015, 12:26 PM  [facility] [address] [place], [address] Phone: [phone]   Fax:  [phone]  Name: Angela Davenport MRN: [id number] Date of Birth: 02/04/1961

## 2015-10-15 NOTE — Progress Notes (Signed)
Subjective:  Patient ID: Angela Davenport, female    DOB: 04/28/1961  Age: 55 y.o. MRN: [id number]  CC: Shoulder Pain  Procedure: Trigger point injection to the left trapezius #3  3  HPI Angela Davenport presents for reevaluation. She was last seen 1 month ago which point she had trigger points to the left trapezius. Prior to that she had a cervical epidural and trigger point approximately a month or so ago area he states that overall she is a 70% improvement in her left neck and shoulder pain. Less pain radiating down to her still having some numbness in the left middle finger. Still to see Dr. Arnoldo Morale in 2 weeks for neurosurgical consultation. She is tolerating medication management without difficulty and doing her stretching exercises as tolerated.   History [name] has a past medical history of Kidney cysts; Hot flashes; Hypothyroidism; Depression; Diabetes mellitus without complication ([place]); Osteoarthritis; Fibrocystic breast; Osteopenia; Gastritis; Hiatal hernia; History of shingles; and GERD (gastroesophageal reflux disease).   She has past surgical history that includes Tubal ligation; Wisdom tooth extraction; Knee arthroscopy (Left); Kidney cyst removal (Left); Knee arthroscopy (Left, 08/01/2013); and Medial partial knee replacement (Left).   Her family history includes Alzheimer's disease in her mother; Cancer in her father and mother; Diabetes in her father; Hypertension in her mother.She reports that she quit smoking about 32 years ago. She has never used smokeless tobacco. She reports that she drinks alcohol. She reports that she does not use illicit drugs.   ---------------------------------------------------------------------------------------------------------------------- Past Medical History  Diagnosis Date  . Kidney cysts   . Hot flashes     history of- states controlled with hormonal gel and Effexor  . Hypothyroidism   . Depression   . Diabetes mellitus without  complication ([place])   . Osteoarthritis   . Fibrocystic breast   . Osteopenia   . Gastritis   . Hiatal hernia   . History of shingles   . GERD (gastroesophageal reflux disease)     No longer has issues    Past Surgical History  Procedure Laterality Date  . Tubal ligation    . Wisdom tooth extraction    . Knee arthroscopy Left   . Kidney cyst removal Left     IV sedation  . Knee arthroscopy Left 08/01/2013    Procedure: ARTHROSCOPY LEFT KNEE WITH MEDIAL MENISCUS DEBRIDEMENT AND CHONDRPLASTY;  Surgeon: Gearlean Alf, MD;  Location: [facility];  Service: Orthopedics;  Laterality: Left;  . Medial partial knee replacement Left     Family History  Problem Relation Age of Onset  . Cancer Mother   . Alzheimer's disease Mother   . Hypertension Mother   . Cancer Father   . Diabetes Father     Steroid induced    Social History  Substance Use Topics  . Smoking status: Former Smoker -- 1.50 packs/day for 8 years    Quit date: 07/29/1983  . Smokeless tobacco: Never Used  . Alcohol Use: 0.0 oz/week    0 Standard drinks or equivalent per week     Comment: socially    ---------------------------------------------------------------------------------------------------------------------- Social History   Social History  . Marital Status: Married    Spouse Name: N/A  . Number of Children: N/A  . Years of Education: N/A   Social History Main Topics  . Smoking status: Former Smoker -- 1.50 packs/day for 8 years    Quit date: 07/29/1983  . Smokeless tobacco: Never Used  . Alcohol Use: 0.0 oz/week  0 Standard drinks or equivalent per week     Comment: socially  . Drug Use: No  . Sexual Activity: Not Asked   Other Topics Concern  . None   Social History Narrative      ----------------------------------------------------------------------------------------------------------------------  ROS Review of Systems  Cardiovascular: Negative Pulmonary: Negative Psychological  negative GU: Negative Hematologic negative   Objective:  BP 122/82 mmHg  Pulse 99  Temp(Src) 98.6 F (37 C) (Oral)  Resp 16  Ht 5\' 6"  (1.676 m)  Wt 175 lb (79.379 kg)  BMI 28.26 kg/m2  SpO2 100%  Physical Exam Patient is a good historian in obvious pain. Pupils are equally round reactive to light and extraocular muscles are intact Heart is regular rate and rhythm without murmur Lungs are clear to auscultation Inspection of the left trapezius reveals a trigger point 2 in the mid body and 2 trigger points in the left rhomboid and diminished strength with extension of the left arm at the elbow as compared to the right however this is improving she also has a slightly diminished hand grasp on the left side compared with the right although this is improving as well right upper extremity strength appears to be well-preserved without apparent dysfunction    Assessment & Plan:   [name] was seen today for shoulder pain.  Diagnoses and all orders for this visit:  Cervicalgia -     TRIGGER POINT INJECTION; Future -     dexamethasone (DECADRON) injection 10 mg; 1 mL (10 mg total) by Other route once. -     lidocaine (PF) (XYLOCAINE) 1 % injection 5 mL; Inject 5 mLs into the skin once.  DDD (degenerative disc disease), lumbar  Cervical radiculitis  Other orders -     lidocaine (PF) (XYLOCAINE) 1 % injection;      ----------------------------------------------------------------------------------------------------------------------  Problem List Items Addressed This Visit    None    Visit Diagnoses    Cervicalgia    -  Primary    Relevant Medications    dexamethasone (DECADRON) injection 10 mg    lidocaine (PF) (XYLOCAINE) 1 % injection 5 mL    Other Relevant Orders    TRIGGER POINT INJECTION    DDD (degenerative disc disease), lumbar        Relevant Medications    dexamethasone (DECADRON) injection 10 mg    Cervical radiculitis            ----------------------------------------------------------------------------------------------------------------------  1. CervicalgiaBased on her response and the nature of examination today with the obvious trigger points on evaluation regarding inject those. We'll defer on repeat cervical epidural today with return to clinic in approximately 2 weeks to a month for possible repeat cervical epidural injection at that time. I'- TRIGGER POINT INJECTION - Ambulatory referral to Neurosurgery which will be an expedited request  2. DDD (degenerative disc disease), lumbar As per #1 -3. Cervical radiculitis  -   ----------------------------------------------------------------------------------------------------------------------  I am having Ms. Brandle maintain her levothyroxine, omeprazole, loratadine, venlafaxine XR, naproxen sodium, traMADol, PRESCRIPTION MEDICATION, methocarbamol, levothyroxine, venlafaxine XR, aspirin EC, metFORMIN, methylPREDNISolone, albuterol, azithromycin, gabapentin, cyclobenzaprine, HYDROcodone-acetaminophen, and predniSONE. We will continue to administer triamcinolone acetonide, sodium chloride flush, ropivacaine (PF) 2 mg/ml (0.2%), midazolam, lidocaine (PF), lactated ringers, iopamidol, dexamethasone, ropivacaine (PF) 2 mg/ml (0.2%), dexamethasone, dexamethasone, and lidocaine (PF).   Meds ordered this encounter  Medications  . dexamethasone (DECADRON) injection 10 mg    Sig:   . lidocaine (PF) (XYLOCAINE) 1 % injection 5 mL    Sig:   .  lidocaine (PF) (XYLOCAINE) 1 % injection    Sig:     Donneta Romberg, Dena: cabinet override       Follow-up: Return in about 3 weeks (around 11/04/2015) for evaluation, procedure.   Procedure:   The trigger point overlying the left trapezius muscle was also prepped with alcohol and injected with a 25-gauge needle with 4 cc of ropivacaine 0.2% mixed with 4 mg of Decadron in a fanlike distribution and this was well-tolerated. I  also injected the 2 trigger points overlying the left rhomboid she was convalesced discharged home in stable condition   Molli Barrows, MD

## 2015-10-17 ENCOUNTER — Ambulatory Visit: Payer: 59 | Admitting: Physical Therapy

## 2015-10-17 DIAGNOSIS — M25512 Pain in left shoulder: Secondary | ICD-10-CM | POA: Diagnosis not present

## 2015-10-17 DIAGNOSIS — M542 Cervicalgia: Secondary | ICD-10-CM

## 2015-10-17 DIAGNOSIS — M6281 Muscle weakness (generalized): Secondary | ICD-10-CM | POA: Diagnosis not present

## 2015-10-17 DIAGNOSIS — M436 Torticollis: Secondary | ICD-10-CM | POA: Diagnosis not present

## 2015-10-17 NOTE — Therapy (Signed)
[facility] [address], [place], [address] Phone: [phone]   Fax:  [phone]  Physical Therapy Treatment  Patient Details  Name: Angela Davenport MRN: [id number] Date of Birth: 07-28-1960 Referring Provider: Vashti Hey  Encounter Date: 10/17/2015      PT End of Session - 10/17/15 1158    Visit Number 5   Number of Visits 13   Date for PT Re-Evaluation 10/31/15   PT Start Time 9355   PT Stop Time 1130   PT Time Calculation (min) 45 min   Activity Tolerance Patient limited by pain   Behavior During Therapy [facility] for tasks assessed/performed      Past Medical History  Diagnosis Date  . Kidney cysts   . Hot flashes     history of- states controlled with hormonal gel and Effexor  . Hypothyroidism   . Depression   . Diabetes mellitus without complication ([place])   . Osteoarthritis   . Fibrocystic breast   . Osteopenia   . Gastritis   . Hiatal hernia   . History of shingles   . GERD (gastroesophageal reflux disease)     No longer has issues    Past Surgical History  Procedure Laterality Date  . Tubal ligation    . Wisdom tooth extraction    . Knee arthroscopy Left   . Kidney cyst removal Left     IV sedation  . Knee arthroscopy Left 08/01/2013    Procedure: ARTHROSCOPY LEFT KNEE WITH MEDIAL MENISCUS DEBRIDEMENT AND CHONDRPLASTY;  Surgeon: Gearlean Alf, MD;  Location: [facility];  Service: Orthopedics;  Laterality: Left;  . Medial partial knee replacement Left     There were no vitals filed for this visit.      Subjective Assessment - 10/17/15 1156    Subjective Pt reports she had additional injections in her L shoulder. Reports she is doing well and would like this to be her last session as she has made progress and is about to go on vacation.   Pertinent History Pain began 3-4 months ago idiopathically upon waking. Pain is described as a burning pain, with little to no relief except  medication, trigger point injections. 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 Pt reports N/T additionally.   Limitations Sitting;Reading;Lifting;Standing;Walking   Patient Stated Goals decr pain.   Currently in Pain? No/denies   Pain Score 0-No pain   Pain Onset More than a month ago              Objective: Reviewed HEP, reassessed strength. Answered pt questions regarding exercises. Progressed grip exercises to blue firm theraputty for gross grip, key grip, pinch grip. Pt had difficulty with middle finger pinch grip, required extensive practice and cuing for performance of this.   1st rib mobs x5 min total, initially highly antalgic but improved following treatment.  Trigger point dry needling performed on suboccipitals, UT, levator scap.  STM performed on these regions. Following this pt reported decr. N/T in shoulder.                       PT Education - 10/17/15 1157    Education provided Yes   Education Details educated pt on discharge instructions   Person(s) Educated Patient   Methods Explanation   Comprehension Verbalized understanding  PT Long Term Goals - 10/17/15 1212    PT LONG TERM GOAL #1   Title Pt will have improved grip strength indicating improvement in overall cervicogenic weakness as seen by L grip of 40#   Baseline 30#   Time 4   Period Weeks   Status Achieved   PT LONG TERM GOAL #2   Title Pt will demonstrate decr. 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 strength is returning however pt is still  limited in grip, triceps and wrist extension strength. Pain is now abolished, pt continues to have mild N/T and was positive for a radial nerve tension test. Encouraged pt to monitor her symptoms and if they have not fully abolished in 6 wks to return for a followup.    Rehab Potential Good   Clinical Impairments Affecting Rehab Potential chronic pain, degree of pain   PT Frequency 3x / week   PT Duration 4 weeks   PT Treatment/Interventions Aquatic Therapy;Dry needling;Therapeutic exercise;Manual techniques;ADLs/Self Care Home Management;Patient/family education   PT Next Visit Plan dry needling, STM, traction   PT Home Exercise Plan Continue as prescribed   Consulted and Agree with Plan of Care Patient      Patient will benefit from skilled therapeutic intervention in order to improve the following deficits and impairments:  Pain, Decreased range of motion, Improper body mechanics, Hypomobility, Impaired sensation, Decreased strength, Dizziness  Visit Diagnosis: Cervicalgia     Problem List Patient Active Problem List   Diagnosis Date Noted  . Diabetes mellitus, type 2 ([place]) 08/06/2014  . Acute medial meniscal tear 08/01/2013    Fisher,Benjamin PT DPT 10/17/2015, 12:47 PM  [facility] [address], [place], [address] Phone: [phone]   Fax:  [phone]  Name: Angela Davenport MRN: [id number] Date of Birth: 07/02/1960

## 2015-10-21 ENCOUNTER — Encounter: Payer: 59 | Admitting: Physical Therapy

## 2015-10-23 ENCOUNTER — Encounter: Payer: 59 | Admitting: Physical Therapy

## 2015-10-24 ENCOUNTER — Encounter: Payer: 59 | Admitting: Physical Therapy

## 2015-10-29 DIAGNOSIS — Z6829 Body mass index (BMI) 29.0-29.9, adult: Secondary | ICD-10-CM | POA: Diagnosis not present

## 2015-10-29 DIAGNOSIS — M542 Cervicalgia: Secondary | ICD-10-CM | POA: Diagnosis not present

## 2015-10-29 DIAGNOSIS — M5412 Radiculopathy, cervical region: Secondary | ICD-10-CM | POA: Diagnosis not present

## 2015-10-30 ENCOUNTER — Ambulatory Visit: Payer: 59 | Admitting: Physical Therapy

## 2015-10-31 ENCOUNTER — Ambulatory Visit: Payer: 59 | Admitting: Physical Therapy

## 2015-11-04 ENCOUNTER — Ambulatory Visit: Payer: 59 | Admitting: Physical Therapy

## 2015-11-06 ENCOUNTER — Encounter: Payer: Self-pay | Admitting: Anesthesiology

## 2015-11-06 ENCOUNTER — Ambulatory Visit: Payer: 59 | Attending: Anesthesiology | Admitting: Anesthesiology

## 2015-11-06 ENCOUNTER — Encounter: Payer: 59 | Admitting: Physical Therapy

## 2015-11-06 VITALS — BP 145/92 | HR 100 | Temp 97.8°F | Resp 16 | Ht 66.0 in | Wt 176.0 lb

## 2015-11-06 DIAGNOSIS — N951 Menopausal and female climacteric states: Secondary | ICD-10-CM | POA: Diagnosis not present

## 2015-11-06 DIAGNOSIS — Z87891 Personal history of nicotine dependence: Secondary | ICD-10-CM | POA: Diagnosis not present

## 2015-11-06 DIAGNOSIS — M542 Cervicalgia: Secondary | ICD-10-CM | POA: Diagnosis not present

## 2015-11-06 DIAGNOSIS — N6019 Diffuse cystic mastopathy of unspecified breast: Secondary | ICD-10-CM | POA: Insufficient documentation

## 2015-11-06 DIAGNOSIS — M5412 Radiculopathy, cervical region: Secondary | ICD-10-CM

## 2015-11-06 DIAGNOSIS — K449 Diaphragmatic hernia without obstruction or gangrene: Secondary | ICD-10-CM | POA: Diagnosis not present

## 2015-11-06 DIAGNOSIS — Z96652 Presence of left artificial knee joint: Secondary | ICD-10-CM | POA: Insufficient documentation

## 2015-11-06 DIAGNOSIS — M5136 Other intervertebral disc degeneration, lumbar region: Secondary | ICD-10-CM | POA: Diagnosis not present

## 2015-11-06 DIAGNOSIS — Z8619 Personal history of other infectious and parasitic diseases: Secondary | ICD-10-CM | POA: Diagnosis not present

## 2015-11-06 DIAGNOSIS — Z833 Family history of diabetes mellitus: Secondary | ICD-10-CM | POA: Diagnosis not present

## 2015-11-06 DIAGNOSIS — M1990 Unspecified osteoarthritis, unspecified site: Secondary | ICD-10-CM | POA: Insufficient documentation

## 2015-11-06 DIAGNOSIS — E039 Hypothyroidism, unspecified: Secondary | ICD-10-CM | POA: Insufficient documentation

## 2015-11-06 DIAGNOSIS — K219 Gastro-esophageal reflux disease without esophagitis: Secondary | ICD-10-CM | POA: Insufficient documentation

## 2015-11-06 DIAGNOSIS — M51369 Other intervertebral disc degeneration, lumbar region without mention of lumbar back pain or lower extremity pain: Secondary | ICD-10-CM

## 2015-11-06 DIAGNOSIS — F329 Major depressive disorder, single episode, unspecified: Secondary | ICD-10-CM | POA: Diagnosis not present

## 2015-11-06 DIAGNOSIS — E119 Type 2 diabetes mellitus without complications: Secondary | ICD-10-CM | POA: Diagnosis not present

## 2015-11-06 DIAGNOSIS — M8580 Other specified disorders of bone density and structure, unspecified site: Secondary | ICD-10-CM | POA: Insufficient documentation

## 2015-11-06 DIAGNOSIS — M25519 Pain in unspecified shoulder: Secondary | ICD-10-CM | POA: Diagnosis present

## 2015-11-06 NOTE — Progress Notes (Signed)
Subjective:  Patient ID: Angela Davenport, female    DOB: [birth year]  Age: 55 y.o. MRN: [id number]  CC: Neck Pain and Shoulder Pain  Procedure:None Previous procedures: Trigger point injection to the left trapezius #3  3  HPI Angela Davenport presents for reevaluation. She has been doing quite well with her neck and shoulder pain. It is under reasonable control at this point. She is having some mild left posterior shoulder pain but this is significantly improved overall. She has been doing occasional stretching strengthening exercises and denies any upper extremity weakness and the numbness has improved in the left hand as well.   History Angela Davenport has a past medical history of Kidney cysts; Hot flashes; Hypothyroidism; Depression; Diabetes mellitus without complication ([place]); Osteoarthritis; Fibrocystic breast; Osteopenia; Gastritis; Hiatal hernia; History of shingles; and GERD (gastroesophageal reflux disease).   She has past surgical history that includes Tubal ligation; Wisdom tooth extraction; Knee arthroscopy (Left); Kidney cyst removal (Left); Knee arthroscopy (Left, 08/01/2013); and Medial partial knee replacement (Left).   Her family history includes Alzheimer's disease in her mother; Cancer in her father and mother; Diabetes in her father; Hypertension in her mother.She reports that she quit smoking about 32 years ago. She has never used smokeless tobacco. She reports that she drinks alcohol. She reports that she does not use illicit drugs.   ---------------------------------------------------------------------------------------------------------------------- Past Medical History  Diagnosis Date  . Kidney cysts   . Hot flashes     history of- states controlled with hormonal gel and Effexor  . Hypothyroidism   . Depression   . Diabetes mellitus without complication ([place])   . Osteoarthritis   . Fibrocystic breast   . Osteopenia   . Gastritis   . Hiatal hernia   . History of  shingles   . GERD (gastroesophageal reflux disease)     No longer has issues    Past Surgical History  Procedure Laterality Date  . Tubal ligation    . Wisdom tooth extraction    . Knee arthroscopy Left   . Kidney cyst removal Left     IV sedation  . Knee arthroscopy Left 08/01/2013    Procedure: ARTHROSCOPY LEFT KNEE WITH MEDIAL MENISCUS DEBRIDEMENT AND CHONDRPLASTY;  Surgeon: Gearlean Alf, MD;  Location: [facility];  Service: Orthopedics;  Laterality: Left;  . Medial partial knee replacement Left     Family History  Problem Relation Age of Onset  . Cancer Mother   . Alzheimer's disease Mother   . Hypertension Mother   . Cancer Father   . Diabetes Father     Steroid induced    Social History  Substance Use Topics  . Smoking status: Former Smoker -- 1.50 packs/day for 8 years    Quit date: 07/29/1983  . Smokeless tobacco: Never Used  . Alcohol Use: 0.0 oz/week    0 Standard drinks or equivalent per week     Comment: socially    ---------------------------------------------------------------------------------------------------------------------- Social History   Social History  . Marital Status: Married    Spouse Name: N/A  . Number of Children: N/A  . Years of Education: N/A   Social History Main Topics  . Smoking status: Former Smoker -- 1.50 packs/day for 8 years    Quit date: 07/29/1983  . Smokeless tobacco: Never Used  . Alcohol Use: 0.0 oz/week    0 Standard drinks or equivalent per week     Comment: socially  . Drug Use: No  . Sexual Activity: Not Asked  Other Topics Concern  . None   Social History Narrative      ----------------------------------------------------------------------------------------------------------------------  ROS Review of Systems  Cardiovascular: Negative Pulmonary: Negative Psychological negative GU: Negative Hematologic negative   Objective:  BP 145/92 mmHg  Pulse 100  Temp(Src) 97.8 F (36.6 C) (Oral)  Resp  16  Ht 5\' 6"  (1.676 m)  Wt 176 lb (79.833 kg)  BMI 28.42 kg/m2  SpO2 100%  Physical Exam Patient is a good historian in obvious pain. Pupils are equally round reactive to light and extraocular muscles are intact Heart is regular rate and rhythm without murmur Lungs are clear to auscultation Good strength in the bilateral upper extremities with good range of motion good muscle tone and bulk  Assessment & Plan:   Angela Davenport was seen today for neck pain and shoulder pain.  Diagnoses and all orders for this visit:  Cervicalgia -     TRIGGER POINT INJECTION; Future  DDD (degenerative disc disease), lumbar -     TRIGGER POINT INJECTION; Future  Cervical radiculitis -     TRIGGER POINT INJECTION; Future     ----------------------------------------------------------------------------------------------------------------------  Problem List Items Addressed This Visit    None    Visit Diagnoses    Cervicalgia    -  Primary    Relevant Orders    TRIGGER POINT INJECTION    DDD (degenerative disc disease), lumbar        Relevant Orders    TRIGGER POINT INJECTION    Cervical radiculitis        Relevant Orders    TRIGGER POINT INJECTION       ----------------------------------------------------------------------------------------------------------------------  1. Cervicalgia we will defer on any repeat injections today and I have gone over some stretching strengthening exercises with her. In regards to her increase in blood sugars with history of diabetes I've encouraged her to follow-up with her primary care physician for management. This is likely transient and ineffective some steroid she's been on. We'll have her return to clinic in 2 months for possible repeat trigger point injection at that time [place] Ambulatory referral to Neurosurgery which will be an expedited request  2. DDD (degenerative disc disease), lumbar As per #1 -3. Cervical  radiculitis  -   ----------------------------------------------------------------------------------------------------------------------  I am having Angela Davenport maintain her levothyroxine, omeprazole, loratadine, venlafaxine XR, naproxen sodium, traMADol, PRESCRIPTION MEDICATION, methocarbamol, levothyroxine, venlafaxine XR, aspirin EC, metFORMIN, methylPREDNISolone, albuterol, azithromycin, gabapentin, cyclobenzaprine, HYDROcodone-acetaminophen, and predniSONE. We will continue to administer triamcinolone acetonide, sodium chloride flush, ropivacaine (PF) 2 mg/ml (0.2%), midazolam, lidocaine (PF), lactated ringers, iopamidol, dexamethasone, ropivacaine (PF) 2 mg/ml (0.2%), dexamethasone, dexamethasone, and lidocaine (PF).   No orders of the defined types were placed in this encounter.       Follow-up: Return in about 2 months (around 01/06/2016) for evaluation, procedure.   Procedure:   The trigger point overlying the left trapezius muscle was also prepped with alcohol and injected with a 25-gauge needle with 4 cc of ropivacaine 0.2% mixed with 4 mg of Decadron in a fanlike distribution and this was well-tolerated. I also injected the 2 trigger points overlying the left rhomboid she was convalesced discharged home in stable condition   Molli Barrows, MD

## 2015-11-06 NOTE — Progress Notes (Signed)
Patient here for f/up.  Patient's pain is better.  Patient reports that her blood sugars have not been controlled since the first steroid injection.  Mainly after the taper.  Fasting this a.m. Was 183  Safety precautions to be maintained throughout the outpatient stay will include: orient to surroundings, keep bed in low position, maintain call bell within reach at all times, provide assistance with transfer out of bed and ambulation.

## 2015-11-07 ENCOUNTER — Encounter: Payer: 59 | Admitting: Physical Therapy

## 2015-11-11 ENCOUNTER — Encounter: Payer: 59 | Admitting: Physical Therapy

## 2015-11-13 ENCOUNTER — Encounter: Payer: 59 | Admitting: Physical Therapy

## 2015-11-14 ENCOUNTER — Encounter: Payer: 59 | Admitting: Physical Therapy

## 2015-11-18 ENCOUNTER — Encounter: Payer: 59 | Admitting: Physical Therapy

## 2015-11-20 ENCOUNTER — Encounter: Payer: 59 | Admitting: Physical Therapy

## 2015-11-21 ENCOUNTER — Encounter: Payer: 59 | Admitting: Physical Therapy

## 2015-11-25 ENCOUNTER — Encounter: Payer: 59 | Admitting: Physical Therapy

## 2015-11-27 ENCOUNTER — Encounter: Payer: 59 | Admitting: Physical Therapy

## 2015-11-28 ENCOUNTER — Encounter: Payer: Self-pay | Admitting: Physical Therapy

## 2015-12-19 NOTE — Therapy (Signed)
[facility] [address] [place], [address] Phone: [phone]   Fax:  [phone]  Patient Details  Name: Angela Davenport MRN: [id number] Date of Birth: February 13, 1961 Referring Provider:  Molli Barrows, MD  Encounter Date: 10/09/2015   Pt arrived for session, generally doing well and felt that since she had tx yesterday and is having additional tx tomorrow would like to skip today to avoid irritating improvement in cervical region.  Fisher,Benjamin  PT DPT  12/19/2015, 3:09 PM  [facility] [address] [place], [address] Phone: [phone]   Fax:  [phone]

## 2016-01-15 ENCOUNTER — Other Ambulatory Visit: Payer: Self-pay | Admitting: Pharmacist

## 2016-01-15 ENCOUNTER — Encounter: Payer: Self-pay | Admitting: Pharmacist

## 2016-01-16 NOTE — Patient Outreach (Addendum)
Angela Davenport) Care Management  [place]   01/16/2016  Angela Davenport Mar 04, 1961 [id number]  Subjective: Angela Davenport is a 55 year old female here today for her Link To Wellness diabetes visit. She voices no concerns at this time. She has been exercising 3 days per week and is up to date with her dental and eye exams. Fasting blood sugars remain slightly elevated ranging 130-150 mg/dl.  Objective:  Blood pressure 112/86, height 1.676 m (5\' 6" ), weight 176 lb (79.8 kg).  A1c: 6.9% 06/14/15 Lipid Panel: TC = 225 mg/dl, TG = 161 mg/dl, HDL = 49 mg/dl, LDL = 143 mg/dl; 03/14/15   Encounter Medications: Outpatient Encounter Prescriptions as of 01/15/2016  Medication Sig  . levothyroxine (SYNTHROID, LEVOTHROID) 137 MCG tablet Take 137 mcg by mouth daily before breakfast.  . metFORMIN (GLUCOPHAGE-XR) 500 MG 24 hr tablet Take 1,000 mg by mouth every evening. 1 tablet at bedtime; increase to 2 tablets as needed  . PRESCRIPTION MEDICATION Apply 1 application topically daily. TESTOSTERONE-ESTROGEN-PROGESTERONE CREAM APPLY TO INNER ARM DAILY  . venlafaxine XR (EFFEXOR-XR) 150 MG 24 hr capsule Take 150 mg by mouth daily with breakfast.  . [DISCONTINUED] albuterol (PROVENTIL HFA;VENTOLIN HFA) 108 (90 Base) MCG/ACT inhaler Inhale 2 puffs into the lungs every 6 (six) hours as needed for wheezing or shortness of breath.  . [DISCONTINUED] aspirin EC 81 MG tablet Take 81 mg by mouth daily. Reported on 11/06/2015  . [DISCONTINUED] azithromycin (ZITHROMAX) 250 MG tablet 2 pills today then 1 pill a day for 4 days (Patient not taking: Reported on 09/19/2015)  . [DISCONTINUED] cyclobenzaprine (FLEXERIL) 10 MG tablet Take 1 tablet (10 mg total) by mouth at bedtime. (Patient not taking: Reported on 11/06/2015)  . [DISCONTINUED] gabapentin (NEURONTIN) 100 MG capsule Take 100 mg by mouth 3 (three) times daily. Reported on 11/06/2015  . [DISCONTINUED] HYDROcodone-acetaminophen (NORCO) 5-325 MG tablet  Take 1 tablet by mouth every 6 (six) hours as needed for moderate pain. (Patient not taking: Reported on 10/14/2015)  . [DISCONTINUED] levothyroxine (SYNTHROID, LEVOTHROID) 125 MCG tablet Take 125 mcg by mouth daily before breakfast. Reported on 11/06/2015  . [DISCONTINUED] loratadine (CLARITIN) 10 MG tablet Take 10 mg by mouth daily. Reported on 11/06/2015  . [DISCONTINUED] methocarbamol (ROBAXIN) 500 MG tablet Take 1 tablet (500 mg total) by mouth 4 (four) times daily. As needed for muscle spasm (Patient not taking: Reported on 09/12/2014)  . [DISCONTINUED] methylPREDNISolone (MEDROL DOSEPAK) 4 MG TBPK tablet Take 6 pills on day one then decrease by 1 pill each day (Patient not taking: Reported on 09/19/2015)  . [DISCONTINUED] naproxen sodium (ANAPROX) 220 MG tablet Take 220-440 mg by mouth 2 (two) times daily with a meal. Reported on 11/06/2015  . [DISCONTINUED] omeprazole (PRILOSEC) 20 MG capsule Take 20 mg by mouth daily. Reported on 11/06/2015  . [DISCONTINUED] predniSONE (STERAPRED UNI-PAK 21 TAB) 10 MG (21) TBPK tablet Day 1 and 2 take 4 tablets Day 3 and 4 take 3 tablets Day 5 and 6 take 2 tablets Day 7 and 8 take 1 tablet (Patient not taking: Reported on 10/14/2015)  . [DISCONTINUED] traMADol (ULTRAM) 50 MG tablet Take 50-100 mg by mouth every 6 (six) hours as needed for moderate pain or severe pain. Reported on 11/06/2015  . [DISCONTINUED] venlafaxine XR (EFFEXOR-XR) 75 MG 24 hr capsule Take 75 mg by mouth daily with breakfast. Reported on 11/06/2015  . [DISCONTINUED] dexamethasone (DECADRON) injection 10 mg   . [DISCONTINUED] dexamethasone (DECADRON) injection 10 mg   . [  DISCONTINUED] dexamethasone (DECADRON) injection 4 mg   . [DISCONTINUED] iopamidol (ISOVUE-M) 41 % intrathecal injection 20 mL   . [DISCONTINUED] lactated ringers infusion 1,000 mL   . [DISCONTINUED] lidocaine (PF) (XYLOCAINE) 1 % injection 5 mL   . [DISCONTINUED] lidocaine (PF) (XYLOCAINE) 1 % injection 5 mL   . [DISCONTINUED]  midazolam (VERSED) injection 5 mg   . [DISCONTINUED] ropivacaine (PF) 2 mg/ml (0.2%) (NAROPIN) epidural 10 mL   . [DISCONTINUED] ropivacaine (PF) 2 mg/ml (0.2%) (NAROPIN) epidural 10 mL   . [DISCONTINUED] sodium chloride flush (NS) 0.9 % injection 10 mL   . [DISCONTINUED] triamcinolone acetonide (KENALOG-40) injection 40 mg    No facility-administered encounter medications on file as of 01/15/2016.      Functional Status: In your present state of health, do you have any difficulty performing the following activities: 01/15/2016 07/17/2015  Hearing? [name]  Vision? N N  Difficulty concentrating or making decisions? N N  Walking or climbing stairs? N N  Dressing or bathing? N N  Doing errands, shopping? N N  Some recent data might be hidden    Fall/Depression Screening: PHQ 2/9 Scores 01/15/2016 10/14/2015 09/30/2015 07/17/2015 09/12/2014  PHQ - 2 Score 0 0 0 0 0  Exception Documentation - - - - (No Data)    Assessment: 1. Diabetes: A1c at goal of less than 7%. Patient states she is not on an ACE inhibitor due to orthostatic hypotension. Patient states she is not taking aspirin as she bruises easily. Patient is concerned about fasting blood sugars being elevated. Discussed options such as increasing metformin dose. Encouraged patient to discuss with provider at next visit.  2. Cholesterol: TC not at goal of less than 200 mg/dl. TG not at goal of less than 150 mg/dl. LDL not at goal of less than 100 mg/dl. Per ADA 2017 guidelines, high intensity statin use is recommended (based on age and LDL).    Plan: 1. Patient to follow up with her primary care physician in in the next two months for her diabetes. 2. Encouraged patient to discuss the addition of a statin with her primary care physician. 3. Patient to continue exercising 3 times per week (water aerobics) and watching her diet. 4. Patient will check her blood sugar 2 hours after a meal and continue to check fasting blood sugars.   [name]  [name], PharmD [facility] [phone]

## 2016-01-31 DIAGNOSIS — Z13220 Encounter for screening for lipoid disorders: Secondary | ICD-10-CM | POA: Diagnosis not present

## 2016-01-31 DIAGNOSIS — R7302 Impaired glucose tolerance (oral): Secondary | ICD-10-CM | POA: Diagnosis not present

## 2016-01-31 DIAGNOSIS — I10 Essential (primary) hypertension: Secondary | ICD-10-CM | POA: Diagnosis not present

## 2016-01-31 DIAGNOSIS — Z0000 Encounter for general adult medical examination without abnormal findings: Secondary | ICD-10-CM | POA: Diagnosis not present

## 2016-02-05 DIAGNOSIS — E039 Hypothyroidism, unspecified: Secondary | ICD-10-CM | POA: Diagnosis not present

## 2016-02-05 DIAGNOSIS — F329 Major depressive disorder, single episode, unspecified: Secondary | ICD-10-CM | POA: Diagnosis not present

## 2016-02-05 DIAGNOSIS — E119 Type 2 diabetes mellitus without complications: Secondary | ICD-10-CM | POA: Diagnosis not present

## 2016-02-05 DIAGNOSIS — Z0000 Encounter for general adult medical examination without abnormal findings: Secondary | ICD-10-CM | POA: Diagnosis not present

## 2016-02-06 ENCOUNTER — Other Ambulatory Visit: Payer: Self-pay | Admitting: Family Medicine

## 2016-02-06 DIAGNOSIS — Z13820 Encounter for screening for osteoporosis: Secondary | ICD-10-CM

## 2016-02-06 DIAGNOSIS — Z1239 Encounter for other screening for malignant neoplasm of breast: Secondary | ICD-10-CM

## 2016-03-10 ENCOUNTER — Ambulatory Visit: Payer: 59

## 2016-04-13 ENCOUNTER — Ambulatory Visit
Admission: RE | Admit: 2016-04-13 | Discharge: 2016-04-13 | Disposition: A | Discharge status: Home / Self Care | Payer: 59 | Source: Ambulatory Visit | Attending: Family Medicine | Admitting: Family Medicine

## 2016-04-13 DIAGNOSIS — Z13820 Encounter for screening for osteoporosis: Secondary | ICD-10-CM | POA: Insufficient documentation

## 2016-04-13 DIAGNOSIS — Z1231 Encounter for screening mammogram for malignant neoplasm of breast: Secondary | ICD-10-CM | POA: Diagnosis not present

## 2016-04-13 DIAGNOSIS — M8588 Other specified disorders of bone density and structure, other site: Secondary | ICD-10-CM | POA: Diagnosis not present

## 2016-04-13 DIAGNOSIS — Z1239 Encounter for other screening for malignant neoplasm of breast: Secondary | ICD-10-CM

## 2016-04-13 DIAGNOSIS — M85852 Other specified disorders of bone density and structure, left thigh: Secondary | ICD-10-CM | POA: Insufficient documentation

## 2016-04-22 ENCOUNTER — Other Ambulatory Visit: Payer: Self-pay | Admitting: Pharmacist

## 2016-04-22 NOTE — Patient Outreach (Signed)
Angela Davenport was seen on 01/15/16 for her 6 month Link To Wellness visit. Today I am following up on her 3 month care plan. She was seen on 02/05/16 by Dr. Lovie Macadamia for her annual physical. A1c = 7.0% on 01/31/16. She was previously taking metformin XR 500 mg - 2 tablets in the evening. The metformin dose was increased to 500 mg XR - 2 tablets twice daily on 02/05/16. Next Link To Wellness visit scheduled for 07/29/16. She is due to follow up with Dr. Lovie Macadamia around March of 2018.  [facility] CM Care Plan Problem One   Flowsheet Row Most Recent Value  Care Plan for Problem One  Active  [facility] Long Term Goal (31-90 days)  Patient will maintain A1c below 7% over the next 90 days per patient or physician report  [facility] Long Term Goal Start Date  01/15/16  [facility] Long Term Goal Met Date  01/31/16 [A1c goal of less than 7% met on 01-31-16]  Interventions for Problem One Long Term Goal  Encouraged patient to continue with current medication regimen and exercising 3 times per week for at least 30 minutes per session     Mykelle Cockerell [name], PharmD [facility] [phone]

## 2016-05-07 DIAGNOSIS — E039 Hypothyroidism, unspecified: Secondary | ICD-10-CM | POA: Diagnosis not present

## 2016-05-07 DIAGNOSIS — E119 Type 2 diabetes mellitus without complications: Secondary | ICD-10-CM | POA: Diagnosis not present

## 2016-05-07 DIAGNOSIS — F329 Major depressive disorder, single episode, unspecified: Secondary | ICD-10-CM | POA: Diagnosis not present

## 2016-05-18 ENCOUNTER — Ambulatory Visit: Payer: Self-pay | Admitting: Physician Assistant

## 2016-05-18 VITALS — BP 120/80 | HR 97 | Temp 98.9°F

## 2016-05-18 DIAGNOSIS — H00012 Hordeolum externum right lower eyelid: Secondary | ICD-10-CM

## 2016-05-18 NOTE — Progress Notes (Signed)
S: c/o sty to r lower lid x 2 d, has been using warm compresses without relief, area has a white head on it now, supervisor sent her over to make sure she can still work, no fever/chills  O: vitals wnl, nad, r lower lid + small sty, no drainage, conjunctiva wnl, neck supple no lymph, lungs c t a, cv rrr  A: sty  P : reassurance, continue warm compresses

## 2016-06-02 ENCOUNTER — Encounter: Payer: Self-pay | Admitting: Physician Assistant

## 2016-06-02 ENCOUNTER — Ambulatory Visit: Payer: Self-pay | Admitting: Physician Assistant

## 2016-06-02 VITALS — BP 134/80 | HR 100 | Temp 100.7°F

## 2016-06-02 DIAGNOSIS — J101 Influenza due to other identified influenza virus with other respiratory manifestations: Secondary | ICD-10-CM

## 2016-06-02 LAB — POCT INFLUENZA A/B
INFLUENZA A, POC: POSITIVE — AB
Influenza B, POC: NEGATIVE

## 2016-06-02 MED ORDER — HYDROCOD POLST-CPM POLST ER 10-8 MG/5ML PO SUER: 5.0000 mL | Freq: Two times a day (BID) | ORAL | 0 refills | Status: DC | PRN

## 2016-06-02 MED ORDER — OSELTAMIVIR PHOSPHATE 75 MG PO CAPS: 75.0000 mg | ORAL_CAPSULE | Freq: Two times a day (BID) | ORAL | 0 refills | Status: DC

## 2016-06-02 NOTE — Progress Notes (Signed)
S: C/o runny nose and congestion with dry cough for 4 days, + fever, chills, not sure how many days she had the fever, denies cp/sob, v/d; mucus was green this am but clear throughout the day, cough is sporadic,   Using otc meds: robitussin  O: PE: vitals wnl, nad,  perrl eomi, normocephalic, tms dull, nasal mucosa red and swollen, throat injected, neck supple no lymph, lungs c t a, cv rrr, neuro intact, flu swab + A  A:  Acute influenza A   P: drink fluids, continue regular meds , use otc meds of choice, return if not improving in 5 days, return earlier if worsening , tamiflu 75mg  bid x 5, tussionex, rx for tamiflu for her husband Lorisa Naqvi as preventative, no allergies to tamiflu

## 2016-07-08 DIAGNOSIS — L814 Other melanin hyperpigmentation: Secondary | ICD-10-CM | POA: Diagnosis not present

## 2016-07-08 DIAGNOSIS — L570 Actinic keratosis: Secondary | ICD-10-CM | POA: Diagnosis not present

## 2016-07-08 DIAGNOSIS — L821 Other seborrheic keratosis: Secondary | ICD-10-CM | POA: Diagnosis not present

## 2016-07-08 DIAGNOSIS — D1801 Hemangioma of skin and subcutaneous tissue: Secondary | ICD-10-CM | POA: Diagnosis not present

## 2016-07-08 DIAGNOSIS — L858 Other specified epidermal thickening: Secondary | ICD-10-CM | POA: Diagnosis not present

## 2016-07-29 ENCOUNTER — Ambulatory Visit: Payer: Self-pay | Admitting: Pharmacist

## 2016-08-05 DIAGNOSIS — E785 Hyperlipidemia, unspecified: Secondary | ICD-10-CM | POA: Diagnosis not present

## 2016-08-05 DIAGNOSIS — E039 Hypothyroidism, unspecified: Secondary | ICD-10-CM | POA: Diagnosis not present

## 2016-08-05 DIAGNOSIS — F329 Major depressive disorder, single episode, unspecified: Secondary | ICD-10-CM | POA: Diagnosis not present

## 2016-08-05 DIAGNOSIS — E119 Type 2 diabetes mellitus without complications: Secondary | ICD-10-CM | POA: Diagnosis not present

## 2017-01-25 ENCOUNTER — Encounter: Payer: Self-pay | Admitting: Physician Assistant

## 2017-01-25 ENCOUNTER — Ambulatory Visit: Payer: Self-pay | Admitting: Physician Assistant

## 2017-01-25 VITALS — BP 122/80 | HR 82 | Temp 98.5°F

## 2017-01-25 DIAGNOSIS — H9202 Otalgia, left ear: Secondary | ICD-10-CM

## 2017-01-25 MED ORDER — OFLOXACIN 0.3 % OT SOLN: 5.0000 [drp] | Freq: Every day | OTIC | 12 refills | Status: AC

## 2017-01-25 NOTE — Progress Notes (Signed)
S: c/o left ear pain, some itching, has tubes in both ears and is hurting a little in left, ran out of drops that she uses, no fever/chills, no cough or congestion  O: vitals wnl, nad, tubes intact, + wax near tube on r, left tm red swollen, some clear liquid at tube, neck supple no lymph, lungs c t a, cv rrr  A: acute ear pain  P: floxin otic drops

## 2017-03-22 DIAGNOSIS — Z13220 Encounter for screening for lipoid disorders: Secondary | ICD-10-CM | POA: Diagnosis not present

## 2017-03-22 DIAGNOSIS — I10 Essential (primary) hypertension: Secondary | ICD-10-CM | POA: Diagnosis not present

## 2017-03-22 DIAGNOSIS — R7302 Impaired glucose tolerance (oral): Secondary | ICD-10-CM | POA: Diagnosis not present

## 2017-04-08 DIAGNOSIS — F329 Major depressive disorder, single episode, unspecified: Secondary | ICD-10-CM | POA: Diagnosis not present

## 2017-04-08 DIAGNOSIS — E039 Hypothyroidism, unspecified: Secondary | ICD-10-CM | POA: Diagnosis not present

## 2017-04-08 DIAGNOSIS — E119 Type 2 diabetes mellitus without complications: Secondary | ICD-10-CM | POA: Diagnosis not present

## 2017-04-08 DIAGNOSIS — Z0000 Encounter for general adult medical examination without abnormal findings: Secondary | ICD-10-CM | POA: Diagnosis not present

## 2017-04-12 ENCOUNTER — Other Ambulatory Visit: Payer: Self-pay | Admitting: Family Medicine

## 2017-04-12 DIAGNOSIS — Z1231 Encounter for screening mammogram for malignant neoplasm of breast: Secondary | ICD-10-CM

## 2017-05-11 ENCOUNTER — Ambulatory Visit
Admission: RE | Admit: 2017-05-11 | Discharge: 2017-05-11 | Disposition: A | Discharge status: Home / Self Care | Payer: 59 | Source: Ambulatory Visit | Attending: Family Medicine | Admitting: Family Medicine

## 2017-05-11 DIAGNOSIS — Z1231 Encounter for screening mammogram for malignant neoplasm of breast: Secondary | ICD-10-CM | POA: Insufficient documentation

## 2017-07-09 DIAGNOSIS — E119 Type 2 diabetes mellitus without complications: Secondary | ICD-10-CM | POA: Diagnosis not present

## 2017-08-20 ENCOUNTER — Encounter: Payer: Self-pay | Admitting: Family Medicine

## 2017-08-20 ENCOUNTER — Ambulatory Visit: Payer: Self-pay | Admitting: Family Medicine

## 2017-08-20 VITALS — BP 124/80 | HR 78 | Temp 99.6°F | Wt 177.0 lb

## 2017-08-20 DIAGNOSIS — R05 Cough: Secondary | ICD-10-CM

## 2017-08-20 DIAGNOSIS — H6690 Otitis media, unspecified, unspecified ear: Secondary | ICD-10-CM

## 2017-08-20 DIAGNOSIS — J3089 Other allergic rhinitis: Secondary | ICD-10-CM

## 2017-08-20 DIAGNOSIS — R059 Cough, unspecified: Secondary | ICD-10-CM

## 2017-08-20 DIAGNOSIS — H60502 Unspecified acute noninfective otitis externa, left ear: Secondary | ICD-10-CM

## 2017-08-20 DIAGNOSIS — R509 Fever, unspecified: Secondary | ICD-10-CM

## 2017-08-20 LAB — POCT INFLUENZA A/B
Influenza A, POC: NEGATIVE
Influenza B, POC: NEGATIVE

## 2017-08-20 LAB — POCT RAPID STREP A (OFFICE): Rapid Strep A Screen: NEGATIVE

## 2017-08-20 MED ORDER — BENZONATATE 100 MG PO CAPS: 100.0000 mg | ORAL_CAPSULE | Freq: Three times a day (TID) | ORAL | 0 refills | Status: DC | PRN

## 2017-08-20 MED ORDER — CETIRIZINE HCL 10 MG PO TABS: 10.0000 mg | ORAL_TABLET | Freq: Every day | ORAL | 0 refills | Status: AC

## 2017-08-20 MED ORDER — CIPROFLOXACIN HCL 0.2 % OT SOLN: 0.2000 mL | Freq: Two times a day (BID) | OTIC | 0 refills | Status: AC

## 2017-08-20 MED ORDER — AMOXICILLIN 875 MG PO TABS
875.0000 mg | ORAL_TABLET | Freq: Two times a day (BID) | ORAL | 0 refills | Status: DC
Start: 2017-08-20 — End: 2018-07-14

## 2017-08-20 NOTE — Patient Instructions (Signed)
Otitis Media, Adult Otitis media is redness, soreness, and puffiness (swelling) in the space just behind your eardrum (middle ear). It may be caused by allergies or infection. It often happens along with a cold. Follow these instructions at home:  Take your medicine as told. Finish it even if you start to feel better.  Only take over-the-counter or prescription medicines for pain, discomfort, or fever as told by your doctor.  Follow up with your doctor as told. Contact a doctor if:  You have otitis media only in one ear, or bleeding from your nose, or both.  You notice a lump on your neck.  You are not getting better in 3-5 days.  You feel worse instead of better. Get help right away if:  You have pain that is not helped with medicine.  You have puffiness, redness, or pain around your ear.  You get a stiff neck.  You cannot move part of your face (paralysis).  You notice that the bone behind your ear hurts when you touch it. This information is not intended to replace advice given to you by your health care provider. Make sure you discuss any questions you have with your health care provider. Document Released: 11/04/2007 Document Revised: 10/24/2015 Document Reviewed: 12/13/2012 Elsevier Interactive Patient Education  2017 Elsevier Inc.  

## 2017-08-20 NOTE — Progress Notes (Signed)
Angela Davenport is a 57 y.o. female  who is here for low grade fever and ear pain for the last few days. She recently experienced an e-visit and was prescribed Tamiflu for her symptoms but decided that she wanted to be examined face to face.   Review of Systems  Constitutional: Positive for fever.  HENT: Positive for congestion and sore throat.   Eyes: Negative.   Respiratory: Positive for cough. Negative for sputum production.   Cardiovascular: Negative.   Gastrointestinal: Negative.   Genitourinary: Negative.   Musculoskeletal: Positive for myalgias.  Neurological: Negative.   Endo/Heme/Allergies: Negative.   Psychiatric/Behavioral: Negative.     O: Vitals:   08/20/17 1035  BP: 124/80  Pulse: 78  Temp: 99.6 F (37.6 C)    Physical Exam  Constitutional: She is oriented to person, place, and time. She appears well-developed and well-nourished. She is active.  HENT:  Head: Normocephalic.  Right Ear: Hearing, external ear and ear canal normal. A middle ear effusion is present.  Left Ear: Hearing normal. Tympanic membrane is injected and erythematous.  Tympanostomy tubes present and in place bilaterally in TM.  Eyes: Pupils are equal, round, and reactive to light.  Neck: Normal range of motion. Neck supple.  Cardiovascular: Normal rate, regular rhythm, intact distal pulses and normal pulses.  Pulmonary/Chest: Effort normal and breath sounds normal.  Abdominal: Soft. Bowel sounds are normal.  Lymphadenopathy:    She has cervical adenopathy.       Right cervical: Superficial cervical adenopathy present.       Left cervical: Superficial cervical adenopathy present.  Neurological: She is alert and oriented to person, place, and time.  Skin: Skin is warm and dry.  Psychiatric: She has a normal mood and affect.   A: 1. Fever, unspecified fever cause   2. Acute otitis media, unspecified otitis media type   3. Acute otitis externa of left ear, unspecified type   4. Allergic  rhinitis due to other allergic trigger, unspecified seasonality   5. Cough    P:  1. Acute otitis media, unspecified otitis media type - amoxicillin (AMOXIL) 875 MG tablet; Take 1 tablet (875 mg total) by mouth 2 (two) times daily.  2. Acute otitis externa of left ear, unspecified type - Ciprofloxacin HCl 0.2 % otic solution; Place 0.2 mLs into the left ear 2 (two) times daily for 7 days.  3. Allergic rhinitis due to other allergic trigger, unspecified seasonality - cetirizine (ZYRTEC) 10 MG tablet; Take 1 tablet (10 mg total) by mouth at bedtime.  4. Cough - benzonatate (TESSALON) 100 MG capsule; Take 1-2 capsules (100-200 mg total) by mouth 3 (three) times daily as needed for cough (with full glass of water).  5. Fever, unspecified fever cause - POCT Influenza A/B - POCT rapid strep A

## 2017-08-23 ENCOUNTER — Telehealth: Payer: Self-pay | Admitting: Emergency Medicine

## 2017-08-23 ENCOUNTER — Encounter: Payer: Self-pay | Admitting: Family Medicine

## 2017-08-23 NOTE — Telephone Encounter (Signed)
Spoke with patient stated that she is doing while thinks she is turning the corner to recovery. Taking amox ,tessalon perls and tamiflu. Stated that the ear drops was unavailable pharmacy will have it on Tuesday she will pick it up. But ears are feeling better, no fever, but still have a cough and the tessalon perls are working. Did inquire about a work note if needed did not make it in today. Per provider if needed will have it available for her patient was informed.

## 2017-09-30 DIAGNOSIS — E119 Type 2 diabetes mellitus without complications: Secondary | ICD-10-CM | POA: Diagnosis not present

## 2017-10-08 DIAGNOSIS — E039 Hypothyroidism, unspecified: Secondary | ICD-10-CM | POA: Diagnosis not present

## 2017-10-08 DIAGNOSIS — E119 Type 2 diabetes mellitus without complications: Secondary | ICD-10-CM | POA: Diagnosis not present

## 2017-10-08 DIAGNOSIS — F329 Major depressive disorder, single episode, unspecified: Secondary | ICD-10-CM | POA: Diagnosis not present

## 2017-10-08 DIAGNOSIS — E785 Hyperlipidemia, unspecified: Secondary | ICD-10-CM | POA: Diagnosis not present

## 2017-10-18 DIAGNOSIS — H5213 Myopia, bilateral: Secondary | ICD-10-CM | POA: Diagnosis not present

## 2017-10-26 DIAGNOSIS — R112 Nausea with vomiting, unspecified: Secondary | ICD-10-CM | POA: Diagnosis not present

## 2018-01-14 DIAGNOSIS — E119 Type 2 diabetes mellitus without complications: Secondary | ICD-10-CM | POA: Diagnosis not present

## 2018-05-06 ENCOUNTER — Other Ambulatory Visit: Payer: Self-pay | Admitting: Family Medicine

## 2018-05-06 DIAGNOSIS — Z1231 Encounter for screening mammogram for malignant neoplasm of breast: Secondary | ICD-10-CM

## 2018-05-18 ENCOUNTER — Ambulatory Visit
Admission: RE | Admit: 2018-05-18 | Discharge: 2018-05-18 | Disposition: A | Discharge status: Home / Self Care | Payer: 59 | Source: Ambulatory Visit | Attending: Family Medicine | Admitting: Family Medicine

## 2018-05-18 DIAGNOSIS — Z1231 Encounter for screening mammogram for malignant neoplasm of breast: Secondary | ICD-10-CM | POA: Insufficient documentation

## 2018-05-21 IMAGING — MG MM DIGITAL SCREENING BILAT W/ TOMO W/ CAD
8 of 13 series · 8 of 29 positions shown · non-contrast
Comparison: Previous exam(s).

CLINICAL DATA: Screening.

EXAM:
2D DIGITAL SCREENING BILATERAL MAMMOGRAM WITH CAD AND ADJUNCT TOMO

[R CC (1 of 2)]
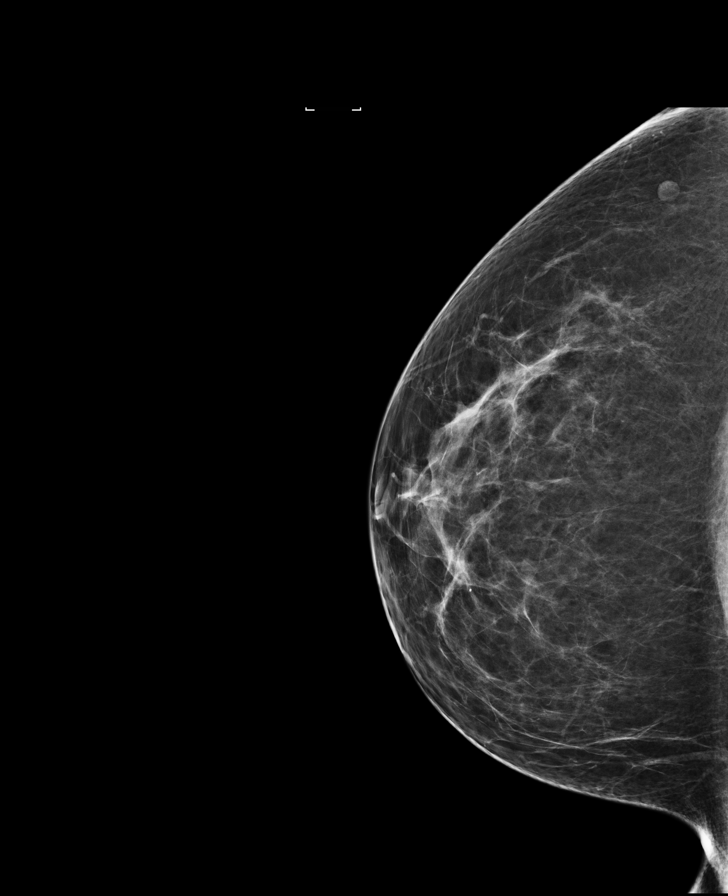

[R MLO]
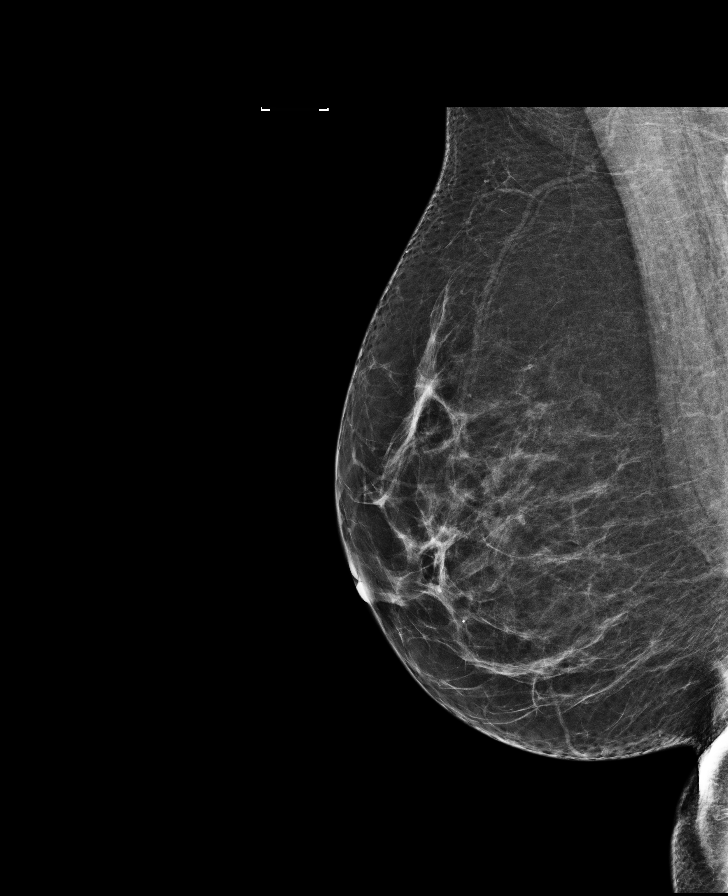

[R CC (2 of 2)]
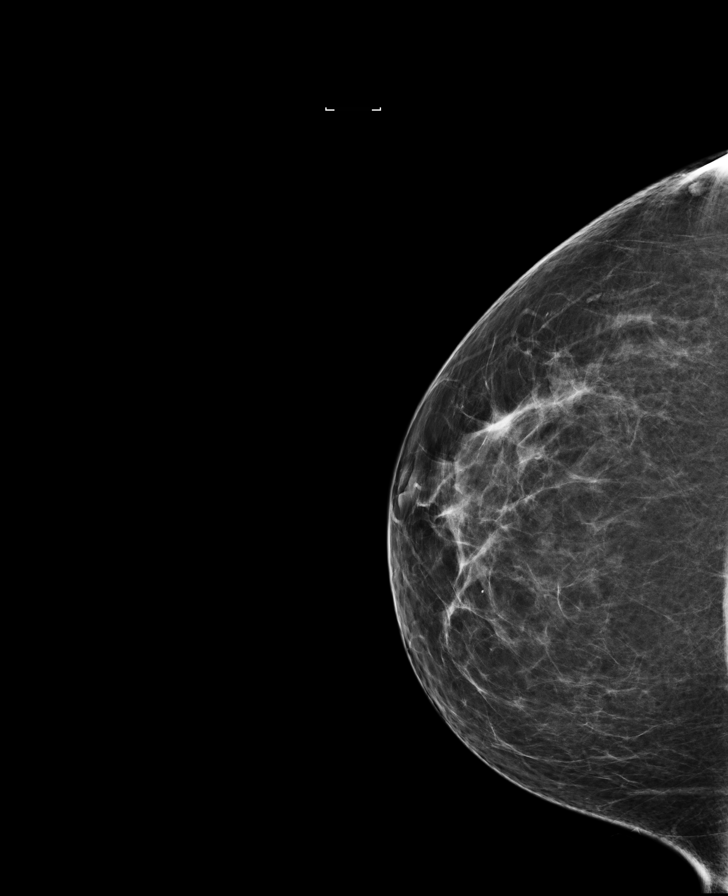

[L CC]
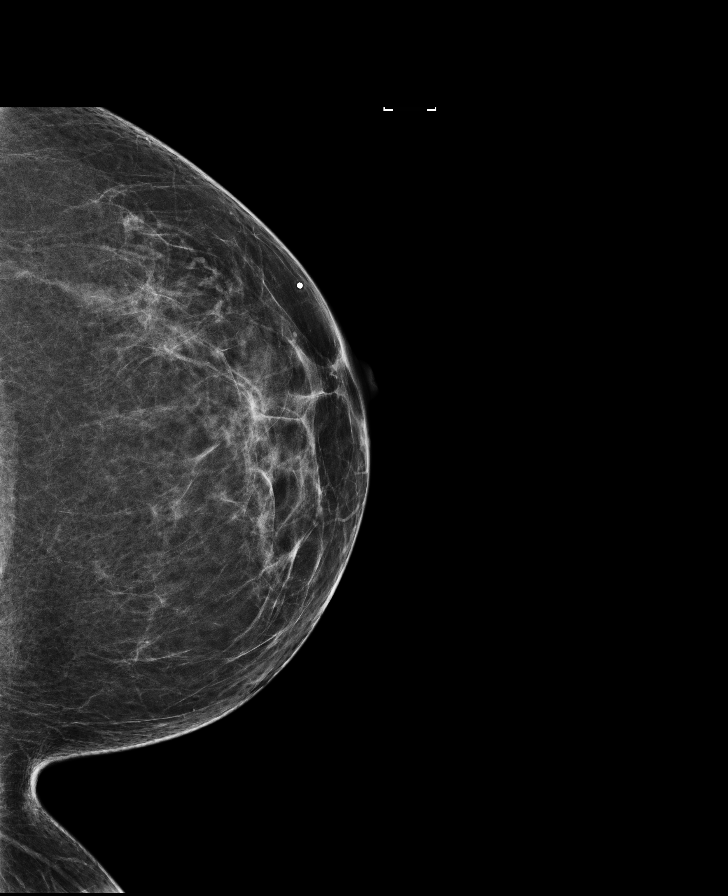

[L CC synth-2D]
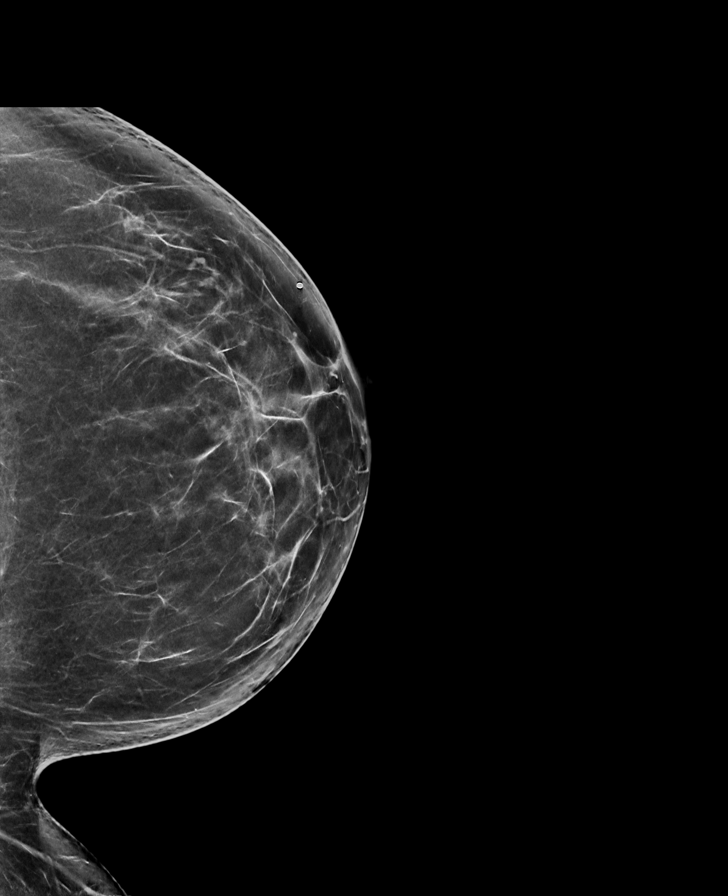

[R CC synth-2D]
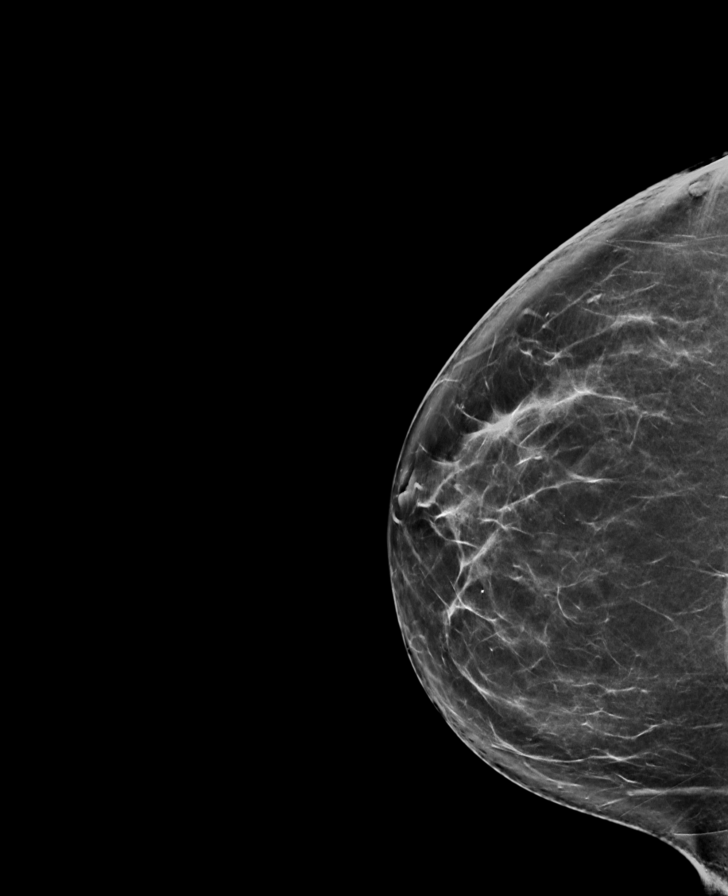

[R MLO synth-2D]
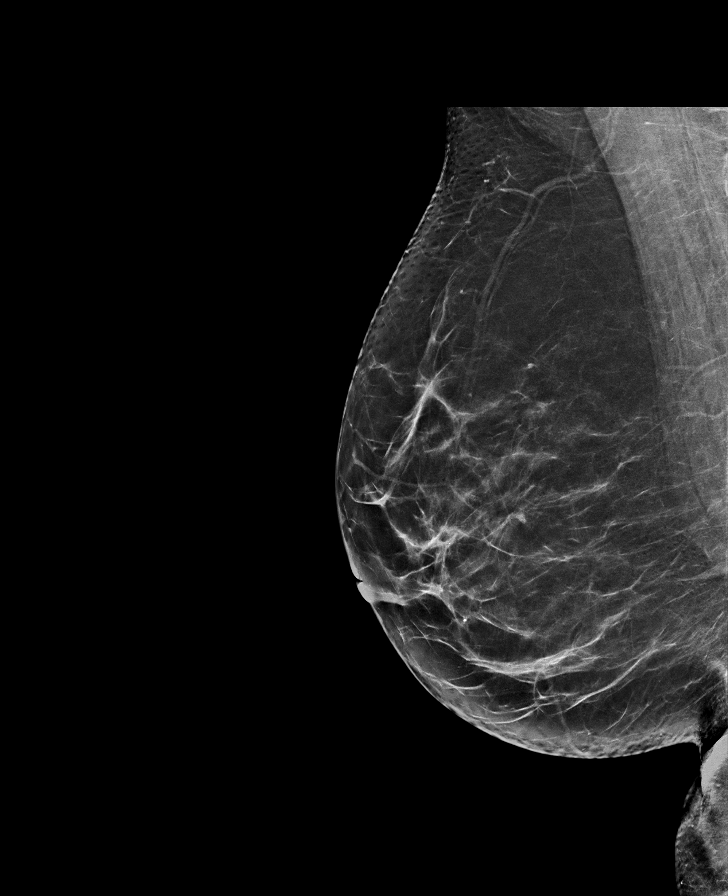

[L MLO synth-2D]
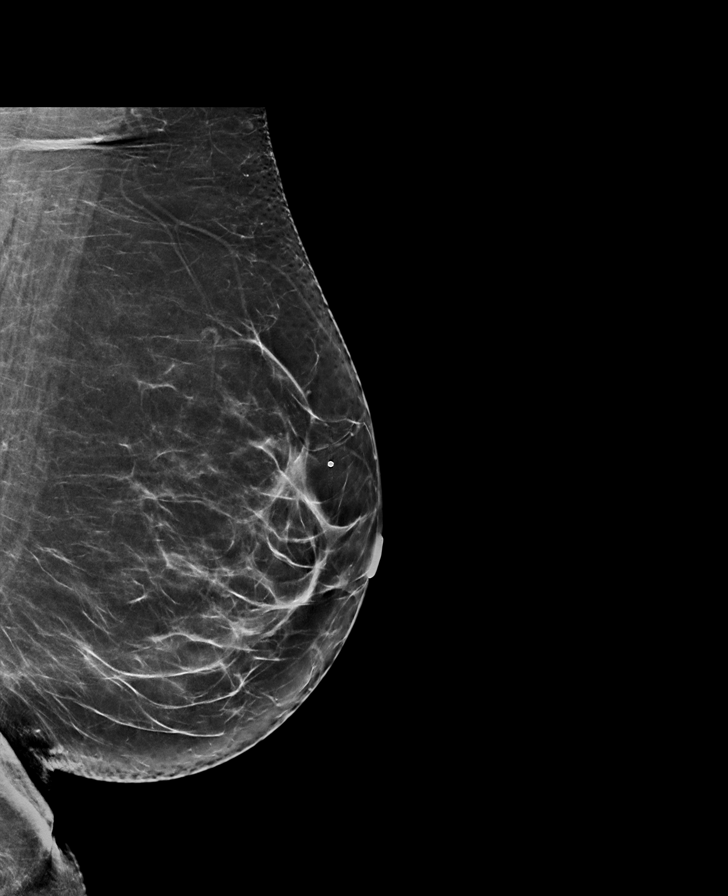

[8 of 29 positions shown; findings below may reference images not displayed]

ACR Breast Density Category b: There are scattered areas of
fibroglandular density.
FINDINGS: There are no findings suspicious for malignancy. Images were
processed with CAD.
IMPRESSION: No mammographic evidence of malignancy. A result letter of this
screening mammogram will be mailed directly to the patient.

RECOMMENDATION:
Screening mammogram in one year. (Code:97-6-RS4)

BI-RADS CATEGORY  1: Negative.

## 2018-05-24 DIAGNOSIS — E785 Hyperlipidemia, unspecified: Secondary | ICD-10-CM | POA: Diagnosis not present

## 2018-05-24 DIAGNOSIS — E119 Type 2 diabetes mellitus without complications: Secondary | ICD-10-CM | POA: Diagnosis not present

## 2018-05-27 DIAGNOSIS — Z124 Encounter for screening for malignant neoplasm of cervix: Secondary | ICD-10-CM | POA: Diagnosis not present

## 2018-05-27 DIAGNOSIS — E785 Hyperlipidemia, unspecified: Secondary | ICD-10-CM | POA: Diagnosis not present

## 2018-05-27 DIAGNOSIS — Z0000 Encounter for general adult medical examination without abnormal findings: Secondary | ICD-10-CM | POA: Diagnosis not present

## 2018-05-27 DIAGNOSIS — E119 Type 2 diabetes mellitus without complications: Secondary | ICD-10-CM | POA: Diagnosis not present

## 2018-05-27 DIAGNOSIS — E039 Hypothyroidism, unspecified: Secondary | ICD-10-CM | POA: Diagnosis not present

## 2018-06-08 ENCOUNTER — Other Ambulatory Visit: Payer: Self-pay

## 2018-06-08 DIAGNOSIS — Z1211 Encounter for screening for malignant neoplasm of colon: Secondary | ICD-10-CM

## 2018-07-14 ENCOUNTER — Other Ambulatory Visit: Payer: Self-pay

## 2018-07-14 ENCOUNTER — Encounter: Payer: Self-pay | Admitting: *Deleted

## 2018-07-26 NOTE — Discharge Instructions (Signed)
General Anesthesia, Adult, Care After  This sheet gives you information about how to care for yourself after your procedure. Your health care provider may also give you more specific instructions. If you have problems or questions, contact your health care provider.  What can I expect after the procedure?  After the procedure, the following side effects are common:  Pain or discomfort at the IV site.  Nausea.  Vomiting.  Sore throat.  Trouble concentrating.  Feeling cold or chills.  Weak or tired.  Sleepiness and fatigue.  Soreness and body aches. These side effects can affect parts of the body that were not involved in surgery.  Follow these instructions at home:    For at least 24 hours after the procedure:  Have a responsible adult stay with you. It is important to have someone help care for you until you are awake and alert.  Rest as needed.  Do not:  Participate in activities in which you could fall or become injured.  Drive.  Use heavy machinery.  Drink alcohol.  Take sleeping pills or medicines that cause drowsiness.  Make important decisions or sign legal documents.  Take care of children on your own.  Eating and drinking  Follow any instructions from your health care provider about eating or drinking restrictions.  When you feel hungry, start by eating small amounts of foods that are soft and easy to digest (bland), such as toast. Gradually return to your regular diet.  Drink enough fluid to keep your urine pale yellow.  If you vomit, rehydrate by drinking water, juice, or clear broth.  General instructions  If you have sleep apnea, surgery and certain medicines can increase your risk for breathing problems. Follow instructions from your health care provider about wearing your sleep device:  Anytime you are sleeping, including during daytime naps.  While taking prescription pain medicines, sleeping medicines, or medicines that make you drowsy.  Return to your normal activities as told by your health care  provider. Ask your health care provider what activities are safe for you.  Take over-the-counter and prescription medicines only as told by your health care provider.  If you smoke, do not smoke without supervision.  Keep all follow-up visits as told by your health care provider. This is important.  Contact a health care provider if:  You have nausea or vomiting that does not get better with medicine.  You cannot eat or drink without vomiting.  You have pain that does not get better with medicine.  You are unable to pass urine.  You develop a skin rash.  You have a fever.  You have redness around your IV site that gets worse.  Get help right away if:  You have difficulty breathing.  You have chest pain.  You have blood in your urine or stool, or you vomit blood.  Summary  After the procedure, it is common to have a sore throat or nausea. It is also common to feel tired.  Have a responsible adult stay with you for the first 24 hours after general anesthesia. It is important to have someone help care for you until you are awake and alert.  When you feel hungry, start by eating small amounts of foods that are soft and easy to digest (bland), such as toast. Gradually return to your regular diet.  Drink enough fluid to keep your urine pale yellow.  Return to your normal activities as told by your health care provider. Ask your health care   provider what activities are safe for you.  This information is not intended to replace advice given to you by your health care provider. Make sure you discuss any questions you have with your health care provider.  Document Released: 08/24/2000 Document Revised: 01/01/2017 Document Reviewed: 01/01/2017  Elsevier Interactive Patient Education  2019 [employer]

## 2018-07-26 NOTE — Anesthesia Preprocedure Evaluation (Addendum)
Anesthesia Evaluation  Patient identified by MRN, date of birth, ID band  Reviewed: NPO status   Airway Mallampati: II  TM Distance: >3 FB Neck ROM: full    Dental no notable dental hx.    Pulmonary neg pulmonary ROS, former smoker,    Pulmonary exam normal        Cardiovascular Exercise Tolerance: Good negative cardio ROS Normal cardiovascular exam     Neuro/Psych Anxiety negative neurological ROS     GI/Hepatic negative GI ROS, Neg liver ROS, GERD  Controlled,  Endo/Other  diabetesHypothyroidism   Renal/GU Renal disease (hx of kidney cyst 10 years ago)  negative genitourinary   Musculoskeletal  (+) Arthritis ,   Abdominal   Peds  Hematology negative hematology ROS (+)   Anesthesia Other Findings pcp stable:  Dennison Nancy, MD at 05/28/2018;  Reproductive/Obstetrics                            Anesthesia Physical Anesthesia Plan  ASA: II  Anesthesia Plan: General   Post-op Pain Management:    Induction:   PONV Risk Score and Plan:   Airway Management Planned: Natural Airway  Additional Equipment:   Intra-op Plan:   Post-operative Plan:   Informed Consent: I have reviewed the patients History and Physical, chart, labs and discussed the procedure including the risks, benefits and alternatives for the proposed anesthesia with the patient or authorized representative who has indicated his/her understanding and acceptance.       Plan Discussed with: CRNA  Anesthesia Plan Comments:         Anesthesia Quick Evaluation

## 2018-07-27 ENCOUNTER — Ambulatory Visit: Payer: No Typology Code available for payment source | Admitting: Anesthesiology

## 2018-07-27 ENCOUNTER — Ambulatory Visit
Admission: RE | Admit: 2018-07-27 | Discharge: 2018-07-27 | Disposition: A | Discharge status: Home / Self Care | Payer: No Typology Code available for payment source | Attending: Gastroenterology | Admitting: Gastroenterology

## 2018-07-27 ENCOUNTER — Encounter
Admission: RE | Disposition: A | Discharge status: Home / Self Care | Payer: Self-pay | Source: Home / Self Care | Attending: Gastroenterology

## 2018-07-27 DIAGNOSIS — F329 Major depressive disorder, single episode, unspecified: Secondary | ICD-10-CM | POA: Insufficient documentation

## 2018-07-27 DIAGNOSIS — Z79899 Other long term (current) drug therapy: Secondary | ICD-10-CM | POA: Diagnosis not present

## 2018-07-27 DIAGNOSIS — Z888 Allergy status to other drugs, medicaments and biological substances status: Secondary | ICD-10-CM | POA: Diagnosis not present

## 2018-07-27 DIAGNOSIS — M1990 Unspecified osteoarthritis, unspecified site: Secondary | ICD-10-CM | POA: Diagnosis not present

## 2018-07-27 DIAGNOSIS — F419 Anxiety disorder, unspecified: Secondary | ICD-10-CM | POA: Insufficient documentation

## 2018-07-27 DIAGNOSIS — Z7984 Long term (current) use of oral hypoglycemic drugs: Secondary | ICD-10-CM | POA: Diagnosis not present

## 2018-07-27 DIAGNOSIS — E119 Type 2 diabetes mellitus without complications: Secondary | ICD-10-CM | POA: Insufficient documentation

## 2018-07-27 DIAGNOSIS — Z887 Allergy status to serum and vaccine status: Secondary | ICD-10-CM | POA: Diagnosis not present

## 2018-07-27 DIAGNOSIS — K219 Gastro-esophageal reflux disease without esophagitis: Secondary | ICD-10-CM | POA: Insufficient documentation

## 2018-07-27 DIAGNOSIS — Z87891 Personal history of nicotine dependence: Secondary | ICD-10-CM | POA: Diagnosis not present

## 2018-07-27 DIAGNOSIS — Z1211 Encounter for screening for malignant neoplasm of colon: Secondary | ICD-10-CM | POA: Diagnosis not present

## 2018-07-27 DIAGNOSIS — Z96652 Presence of left artificial knee joint: Secondary | ICD-10-CM | POA: Insufficient documentation

## 2018-07-27 DIAGNOSIS — K640 First degree hemorrhoids: Secondary | ICD-10-CM | POA: Diagnosis not present

## 2018-07-27 DIAGNOSIS — D122 Benign neoplasm of ascending colon: Secondary | ICD-10-CM

## 2018-07-27 DIAGNOSIS — K449 Diaphragmatic hernia without obstruction or gangrene: Secondary | ICD-10-CM | POA: Insufficient documentation

## 2018-07-27 DIAGNOSIS — E039 Hypothyroidism, unspecified: Secondary | ICD-10-CM | POA: Diagnosis not present

## 2018-07-27 DIAGNOSIS — M8580 Other specified disorders of bone density and structure, unspecified site: Secondary | ICD-10-CM | POA: Insufficient documentation

## 2018-07-27 DIAGNOSIS — K2970 Gastritis, unspecified, without bleeding: Secondary | ICD-10-CM | POA: Insufficient documentation

## 2018-07-27 DIAGNOSIS — Z8249 Family history of ischemic heart disease and other diseases of the circulatory system: Secondary | ICD-10-CM | POA: Diagnosis not present

## 2018-07-27 DIAGNOSIS — N6019 Diffuse cystic mastopathy of unspecified breast: Secondary | ICD-10-CM | POA: Diagnosis not present

## 2018-07-27 HISTORY — PX: POLYPECTOMY: SHX5525

## 2018-07-27 HISTORY — PX: COLONOSCOPY WITH PROPOFOL: SHX5780

## 2018-07-27 LAB — GLUCOSE, CAPILLARY
Glucose-Capillary: 112 mg/dL — ABNORMAL HIGH (ref 70–99)
Glucose-Capillary: 105 mg/dL — ABNORMAL HIGH (ref 70–99)

## 2018-07-27 SURGERY — COLONOSCOPY WITH PROPOFOL
Anesthesia: General | Site: Rectum

## 2018-07-27 MED ORDER — PROPOFOL 10 MG/ML IV BOLUS
INTRAVENOUS | Status: DC | PRN
  Administered 2018-07-27 (×7): 50 mg via INTRAVENOUS

## 2018-07-27 MED ORDER — STERILE WATER FOR IRRIGATION IR SOLN
Status: DC | PRN
  Administered 2018-07-27: 08:00:00

## 2018-07-27 MED ORDER — OXYCODONE HCL 5 MG PO TABS: 5.0000 mg | ORAL_TABLET | Freq: Once | ORAL | Status: DC | PRN

## 2018-07-27 MED ORDER — SODIUM CHLORIDE 0.9 % IV SOLN: INTRAVENOUS | Status: DC

## 2018-07-27 MED ORDER — LACTATED RINGERS IV SOLN
INTRAVENOUS | Status: DC
  Administered 2018-07-27 (×2): via INTRAVENOUS

## 2018-07-27 MED ORDER — OXYCODONE HCL 5 MG/5ML PO SOLN: 5.0000 mg | Freq: Once | ORAL | Status: DC | PRN

## 2018-07-27 SURGICAL SUPPLY — 6 items
CANISTER SUCT 1200ML W/VALVE (MISCELLANEOUS) ×2 IMPLANT
FORCEPS BIOP RAD 4 LRG CAP 4 (CUTTING FORCEPS) ×2 IMPLANT
GOWN CVR UNV OPN BCK APRN NK (MISCELLANEOUS) ×2 IMPLANT
GOWN ISOL THUMB LOOP REG UNIV (MISCELLANEOUS) ×2
KIT ENDO PROCEDURE OLY (KITS) ×2 IMPLANT
WATER STERILE IRR 250ML POUR (IV SOLUTION) ×2 IMPLANT

## 2018-07-27 NOTE — Anesthesia Postprocedure Evaluation (Signed)
Anesthesia Post Note  Patient: Angela Davenport  Procedure(s) Performed: COLONOSCOPY WITH PROPOFOL (N/A Rectum) POLYPECTOMY (N/A Rectum)  Patient location during evaluation: PACU Anesthesia Type: General Level of consciousness: awake and alert Pain management: pain level controlled Vital Signs Assessment: post-procedure vital signs reviewed and stable Respiratory status: spontaneous breathing, nonlabored ventilation, respiratory function stable and patient connected to nasal cannula oxygen Cardiovascular status: blood pressure returned to baseline and stable Postop Assessment: no apparent nausea or vomiting Anesthetic complications: no    Jadan Hinojos

## 2018-07-27 NOTE — H&P (Signed)
Jonathon Bellows, MD [address] [place], [place], [place], [address] [address], [place], [place], [place], [address] Phone: [phone]  Fax: [phone]  Primary Care Physician:  Juluis Pitch, MD   Pre-Procedure History & Physical: HPI:  Angela Davenport is a 58 y.o. female is here for an colonoscopy.   Past Medical History:  Diagnosis Date  . Depression   . Diabetes mellitus without complication (HCC)    Type 2  . Fibrocystic breast   . Gastritis   . GERD (gastroesophageal reflux disease)    No longer has issues  . Hiatal hernia   . History of shingles   . Hot flashes    history of- states controlled with hormonal gel and Effexor  . Hypothyroidism   . Kidney cysts   . Osteoarthritis    left knee, right thumb  . Osteopenia     Past Surgical History:  Procedure Laterality Date  . KIDNEY CYST REMOVAL Left    IV sedation  . KNEE ARTHROSCOPY Left   . KNEE ARTHROSCOPY Left 08/01/2013   Procedure: ARTHROSCOPY LEFT KNEE WITH MEDIAL MENISCUS DEBRIDEMENT AND CHONDRPLASTY;  Surgeon: Gearlean Alf, MD;  Location: [facility];  Service: Orthopedics;  Laterality: Left;  Marland Kitchen MEDIAL PARTIAL KNEE REPLACEMENT Left 08/14/2014   Dr Roland Rack, [facility]  . TUBAL LIGATION    . WISDOM TOOTH EXTRACTION      Prior to Admission medications   Medication Sig Start Date End Date Taking? Authorizing Provider  atorvastatin (LIPITOR) 20 MG tablet Take by mouth. 02/05/16 07/14/18 Yes [provider]  cetirizine (ZYRTEC) 10 MG tablet Take 1 tablet (10 mg total) by mouth at bedtime. Patient taking differently: Take 10 mg by mouth at bedtime as needed.  08/20/17  Yes Shella Maxim, NP  Dulaglutide (TRULICITY) 6.65 LD/3.5TS SOPN Inject into the skin every 14 (fourteen) days. Fridays   Yes [provider]  levothyroxine (SYNTHROID, LEVOTHROID) 125 MCG tablet Take by mouth. 02/05/16  Yes [provider]  loratadine (CLARITIN) 10 MG tablet Take by mouth daily as needed.    Yes  [provider]  metFORMIN (GLUCOPHAGE-XR) 500 MG 24 hr tablet Take 1,000 mg by mouth every evening. 1 tablet at bedtime; increase to 2 tablets as needed   Yes [provider]  ofloxacin (FLOXIN) 0.3 % OTIC solution Place 5 drops into both ears daily. 01/25/17  Yes Versie Starks, PA-C  PRESCRIPTION MEDICATION Apply 1 application topically daily. TESTOSTERONE-ESTROGEN-PROGESTERONE CREAM APPLY TO INNER ARM DAILY   Yes [provider]  venlafaxine XR (EFFEXOR-XR) 150 MG 24 hr capsule Take 150 mg by mouth daily with breakfast.   Yes [provider]    Allergies as of 06/08/2018 - Review Complete 08/20/2017  Allergen Reaction Noted  . Diclofenac sodium Diarrhea and Other (See Comments) 07/28/2013  . Influenza vaccines  07/26/2013  . Other  07/27/2013    Family History  Problem Relation Age of Onset  . Cancer Mother   . Alzheimer's disease Mother   . Hypertension Mother   . Cancer Father   . Diabetes Father        Steroid induced    Social History   Socioeconomic History  . Marital status: Married    Spouse name: Not on file  . Number of children: Not on file  . Years of education: Not on file  . Highest education level: Not on file  Occupational History  . Not on file  Social Needs  .  Financial resource strain: Not on file  . Food insecurity:    Worry: Not on file    Inability: Not on file  . Transportation needs:    Medical: Not on file    Non-medical: Not on file  Tobacco Use  . Smoking status: Former Smoker    Packs/day: 1.50    Years: 8.00    Pack years: 12.00    Last attempt to quit: 07/29/1983    Years since quitting: 35.0  . Smokeless tobacco: Never Used  Substance and Sexual Activity  . Alcohol use: Yes    Alcohol/week: 1.0 standard drinks    Types: 1 Standard drinks or equivalent per week    Comment: socially  . Drug use: No  . Sexual activity: Not on file  Lifestyle  . Physical activity:    Days per week: Not on file      Minutes per session: Not on file  . Stress: Not on file  Relationships  . Social connections:    Talks on phone: Not on file    Gets together: Not on file    Attends religious service: Not on file    Active member of club or organization: Not on file    Attends meetings of clubs or organizations: Not on file    Relationship status: Not on file  . Intimate partner violence:    Fear of current or ex partner: Not on file    Emotionally abused: Not on file    Physically abused: Not on file    Forced sexual activity: Not on file  Other Topics Concern  . Not on file  Social History Narrative  . Not on file    Review of Systems: See HPI, otherwise negative ROS  Physical Exam: BP 132/74   Pulse 82   Temp 97.9 F (36.6 C) (Temporal)   Resp 16   Ht 5\' 6"  (1.676 m)   Wt 78 kg   SpO2 98%   BMI 27.76 kg/m  General:   Alert,  pleasant and cooperative in NAD Head:  Normocephalic and atraumatic. Neck:  Supple; no masses or thyromegaly. Lungs:  Clear throughout to auscultation, normal respiratory effort.    Heart:  +S1, +S2, Regular rate and rhythm, No edema. Abdomen:  Soft, nontender and nondistended. Normal bowel sounds, without guarding, and without rebound.   Neurologic:  Alert and  oriented x4;  grossly normal neurologically.  Impression/Plan: Angela Davenport is here for an colonoscopy to be performed for Screening colonoscopy average risk   Risks, benefits, limitations, and alternatives regarding  colonoscopy have been reviewed with the patient.  Questions have been answered.  All parties agreeable.   Jonathon Bellows, MD  07/27/2018, 7:28 AM

## 2018-07-27 NOTE — Transfer of Care (Signed)
Immediate Anesthesia Transfer of Care Note  Patient: Angela Davenport  Procedure(s) Performed: COLONOSCOPY WITH PROPOFOL (N/A Rectum) POLYPECTOMY (N/A Rectum)  Patient Location: PACU  Anesthesia Type: General  Level of Consciousness: awake, alert  and patient cooperative  Airway and Oxygen Therapy: Patient Spontanous Breathing and Patient connected to supplemental oxygen  Post-op Assessment: Post-op Vital signs reviewed, Patient's Cardiovascular Status Stable, Respiratory Function Stable, Patent Airway and No signs of Nausea or vomiting  Post-op Vital Signs: Reviewed and stable  Complications: No apparent anesthesia complications

## 2018-07-27 NOTE — Op Note (Signed)
[facility] Patient Name: Angela Davenport Procedure Date: 07/27/2018 7:13 AM MRN: [id number] Account #: [id number] Date of Birth: 06/08/1960 Admit Type: Outpatient Age: 58 Room: [facility] OR ROOM 01 Gender: Female Note Status: Finalized Procedure:            Colonoscopy Indications:          Screening for colorectal malignant neoplasm Providers:            Jonathon Bellows MD, MD Referring MD:         Youlanda Roys. Lovie Macadamia, MD (Referring MD) Medicines:            Monitored Anesthesia Care Complications:        No immediate complications. Procedure:            Pre-Anesthesia Assessment:                       - Prior to the procedure, a History and Physical was                        performed, and patient medications, allergies and                        sensitivities were reviewed. The patient's tolerance of                        previous anesthesia was reviewed.                       - The risks and benefits of the procedure and the                        sedation options and risks were discussed with the                        patient. All questions were answered and informed                        consent was obtained.                       - ASA Grade Assessment: II - A patient with mild                        systemic disease.                       After obtaining informed consent, the colonoscope was                        passed under direct vision. Throughout the procedure,                        the patient's blood pressure, pulse, and oxygen                        saturations were monitored continuously. The                        Colonoscope was introduced through the anus and  advanced to the the cecum, identified by the                        appendiceal orifice, IC valve and transillumination.                        The colonoscopy was performed with ease. The patient                        tolerated the procedure well. The  quality of the bowel                        preparation was good. Findings:      The perianal and digital rectal examinations were normal.      A 3 mm polyp was found in the ascending colon. The polyp was sessile.       The polyp was removed with a cold biopsy forceps. Resection and       retrieval were complete.      Non-bleeding internal hemorrhoids were found during retroflexion. The       hemorrhoids were medium-sized and Grade I (internal hemorrhoids that do       not prolapse).      The exam was otherwise without abnormality on direct and retroflexion       views. Impression:           - One 3 mm polyp in the ascending colon, removed with a                        cold biopsy forceps. Resected and retrieved.                       - Non-bleeding internal hemorrhoids.                       - The examination was otherwise normal on direct and                        retroflexion views. Recommendation:       - Discharge patient to home (with escort).                       - Resume previous diet.                       - Continue present medications.                       - Await pathology results.                       - Repeat colonoscopy for surveillance based on                        pathology results. Procedure Code(s):    --- Professional ---                       [phone], Colonoscopy, flexible; with biopsy, single or                        multiple Diagnosis Code(s):    --- Professional ---  Z12.11, Encounter for screening for malignant neoplasm                        of colon                       D12.2, Benign neoplasm of ascending colon                       K64.0, First degree hemorrhoids CPT copyright 2018 American Medical Association. All rights reserved. The codes documented in this report are preliminary and upon coder review may  be revised to meet current compliance requirements. Jonathon Bellows, MD Jonathon Bellows MD, MD 07/27/2018 7:56:13 AM This report  has been signed electronically. Number of Addenda: 0 Note Initiated On: 07/27/2018 7:13 AM Scope Withdrawal Time: 0 hours 13 minutes 54 seconds  Total Procedure Duration: 0 hours 16 minutes 25 seconds       [facility]

## 2018-07-27 NOTE — Addendum Note (Signed)
Addendum  created 07/27/18 0816 by Lerry Liner, CRNA   Intraprocedure Flowsheets edited

## 2018-07-28 ENCOUNTER — Encounter: Payer: Self-pay | Admitting: Gastroenterology

## 2018-07-29 LAB — SURGICAL PATHOLOGY

## 2018-08-01 ENCOUNTER — Encounter: Payer: Self-pay | Admitting: Gastroenterology

## 2018-08-09 ENCOUNTER — Telehealth: Payer: Self-pay

## 2018-08-09 NOTE — Telephone Encounter (Signed)
Spoke with pt and informed her of lab results and Dr. Georgeann Oppenheim instructions to repeat colonoscopy in 7 years.

## 2018-08-09 NOTE — Telephone Encounter (Signed)
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   Dr Jonathon Bellows MD,MRCP [facility]) Gastroenterology/Hepatology Pager: [phone]  C/c Juluis Pitch, MD

## 2018-08-23 MED FILL — TRULICITY 0.75 MG/0.5 ML PE: 0.75 | 84 days supply | Qty: 6 | Fill #0

## 2018-08-23 MED FILL — ATORVASTATIN 20 MG TABLET: 20 | 90 days supply | Qty: 90 | Fill #0

## 2018-11-14 ENCOUNTER — Other Ambulatory Visit: Payer: Self-pay | Admitting: *Deleted

## 2019-04-17 ENCOUNTER — Ambulatory Visit (INDEPENDENT_AMBULATORY_CARE_PROVIDER_SITE_OTHER): Payer: No Typology Code available for payment source | Admitting: Gastroenterology

## 2019-04-17 ENCOUNTER — Other Ambulatory Visit: Payer: Self-pay

## 2019-04-17 ENCOUNTER — Encounter: Payer: Self-pay | Admitting: Gastroenterology

## 2019-04-17 VITALS — BP 118/79 | HR 103 | Temp 98.4°F | Resp 18 | Wt 166.0 lb

## 2019-04-17 DIAGNOSIS — K645 Perianal venous thrombosis: Secondary | ICD-10-CM

## 2019-04-17 DIAGNOSIS — K642 Third degree hemorrhoids: Secondary | ICD-10-CM

## 2019-04-17 NOTE — Progress Notes (Signed)
Angela Darby, MD [address]  [place]  [place], [place] [address]  Main: [phone]  Fax: [phone]    Gastroenterology Consultation  Referring Provider:     Juluis Pitch, MD Primary Care Physician:  Juluis Pitch, MD Primary Gastroenterologist:  Dr. Jonathon Bellows Reason for Consultation:     Symptomatic hemorrhoids        HPI:   Angela Davenport is a 58 y.o. female referred by Dr. Juluis Pitch, MD  for consultation & management of symptomatic hemorrhoid.  Patient reports that she has been experiencing severe pain, pressure and swelling in her rectum for last 1 week with no evidence of rectal bleeding.  She Tucks pads, which hazel, Preparation H and sitz baths and in last 2 days, symptoms have improved.  She still has a swelling and has to push it inside.  She denies constipation, straining. She is interested to undergo hemorrhoid ligation  NSAIDs: None  Antiplts/Anticoagulants/Anti thrombotics: None  GI Procedures:  Screening colonoscopy 07/27/2018 - One 3 mm polyp in the ascending colon, removed with a cold biopsy forceps. Resected and retrieved. - Non-bleeding internal hemorrhoids. - The examination was otherwise normal on direct and retroflexion views.  DIAGNOSIS:  A. COLON POLYP X 1, ASCENDING; COLD SNARE:  - TUBULAR ADENOMA.  - NEGATIVE FOR HIGH-GRADE DYSPLASIA AND MALIGNANCY.   Past Medical History:  Diagnosis Date  . Depression   . Diabetes mellitus without complication (HCC)    Type 2  . Fibrocystic breast   . Gastritis   . GERD (gastroesophageal reflux disease)    No longer has issues  . Hiatal hernia   . History of shingles   . Hot flashes    history of- states controlled with hormonal gel and Effexor  . Hypothyroidism   . Kidney cysts   . Osteoarthritis    left knee, right thumb  . Osteopenia     Past Surgical History:  Procedure Laterality Date  . COLONOSCOPY WITH PROPOFOL N/A 07/27/2018   Procedure: COLONOSCOPY WITH  PROPOFOL;  Surgeon: Jonathon Bellows, MD;  Location: [facility];  Service: Endoscopy;  Laterality: N/A;  Diabetic - oral meds  . KIDNEY CYST REMOVAL Left    IV sedation  . KNEE ARTHROSCOPY Left   . KNEE ARTHROSCOPY Left 08/01/2013   Procedure: ARTHROSCOPY LEFT KNEE WITH MEDIAL MENISCUS DEBRIDEMENT AND CHONDRPLASTY;  Surgeon: Gearlean Alf, MD;  Location: [facility];  Service: Orthopedics;  Laterality: Left;  Marland Kitchen MEDIAL PARTIAL KNEE REPLACEMENT Left 08/14/2014   Dr Roland Rack, [facility]  . POLYPECTOMY N/A 07/27/2018   Procedure: POLYPECTOMY;  Surgeon: Jonathon Bellows, MD;  Location: [facility];  Service: Endoscopy;  Laterality: N/A;  . TUBAL LIGATION    . WISDOM TOOTH EXTRACTION      Current Outpatient Medications:  .  cetirizine (ZYRTEC) 10 MG tablet, Take 1 tablet (10 mg total) by mouth at bedtime. (Patient taking differently: Take 10 mg by mouth at bedtime as needed. ), Disp: 15 tablet, Rfl: 0 .  Continuous Blood Gluc Receiver (FREESTYLE LIBRE READER) DEVI, Use 1 each every 10 (ten) days, Disp: , Rfl:  .  Continuous Blood Gluc Sensor (FREESTYLE LIBRE SENSOR SYSTEM) MISC, Use 1 each every 10 (ten) days, Disp: , Rfl:  .  Dulaglutide (TRULICITY) A999333 0000000 SOPN, Inject into the skin every 14 (fourteen) days. Fridays, Disp: , Rfl:  .  glucose blood (TRUE METRIX BLOOD GLUCOSE TEST) test strip, USE 2 TIMES DAILY AS INSTRUCTED, Disp: , Rfl:  .  levothyroxine (SYNTHROID, LEVOTHROID) 125 MCG tablet, Take by mouth., Disp: , Rfl:  .  loratadine (CLARITIN) 10 MG tablet, Take by mouth daily as needed. , Disp: , Rfl:  .  metFORMIN (GLUCOPHAGE-XR) 500 MG 24 hr tablet, Take 1,000 mg by mouth every evening. 1 tablet at bedtime; increase to 2 tablets as needed, Disp: , Rfl:  .  ofloxacin (FLOXIN) 0.3 % OTIC solution, Place 5 drops into both ears daily., Disp: 5 mL, Rfl: 12 .  PRESCRIPTION MEDICATION, Apply 1 application topically daily. TESTOSTERONE-ESTROGEN-PROGESTERONE CREAM APPLY TO INNER ARM DAILY, Disp: ,  Rfl:  .  venlafaxine XR (EFFEXOR-XR) 150 MG 24 hr capsule, Take 150 mg by mouth daily with breakfast., Disp: , Rfl:  .  atorvastatin (LIPITOR) 20 MG tablet, Take by mouth., Disp: , Rfl:  .  naproxen sodium (ALEVE) 220 MG tablet, Take by mouth., Disp: , Rfl:  .  venlafaxine XR (EFFEXOR-XR) 75 MG 24 hr capsule, Take by mouth., Disp: , Rfl:      Family History  Problem Relation Age of Onset  . Cancer Mother   . Alzheimer's disease Mother   . Hypertension Mother   . Cancer Father   . Diabetes Father        Steroid induced     Social History   Tobacco Use  . Smoking status: Former Smoker    Packs/day: 1.50    Years: 8.00    Pack years: 12.00    Quit date: 07/29/1983    Years since quitting: 35.7  . Smokeless tobacco: Never Used  Substance Use Topics  . Alcohol use: Yes    Alcohol/week: 1.0 standard drinks    Types: 1 Standard drinks or equivalent per week    Comment: socially  . Drug use: No    Allergies as of 04/17/2019 - Review Complete 04/17/2019  Allergen Reaction Noted  . Diclofenac sodium Diarrhea and Other (See Comments) 07/28/2013  . Influenza vaccines  07/26/2013  . Other  07/27/2013    Review of Systems:    All systems reviewed and negative except where noted in HPI.   Physical Exam:  BP 118/79 (BP Location: Left Arm, Patient Position: Sitting, Cuff Size: Normal)   Pulse (!) 103   Temp 98.4 F (36.9 C)   Resp 18   Wt 166 lb (75.3 kg)   BMI 26.79 kg/m  No LMP recorded. Patient is postmenopausal.  General:   Alert,  Well-developed, well-nourished, pleasant and cooperative in NAD Head:  Normocephalic and atraumatic. Eyes:  Sclera clear, no icterus.   Conjunctiva pink. Ears:  Normal auditory acuity. Nose:  No deformity, discharge, or lesions. Mouth:  No deformity or lesions,oropharynx pink & moist. Neck:  Supple; no masses or thyromegaly. Lungs:  Respirations even and unlabored.  Clear throughout to auscultation.   No wheezes, crackles, or rhonchi. No  acute distress. Heart:  Regular rate and rhythm; no murmurs, clicks, rubs, or gallops. Abdomen:  Normal bowel sounds. Soft, non-tender and non-distended without masses, hepatosplenomegaly or hernias noted.  No guarding or rebound tenderness.   Rectal: Resolving thrombosed left lateral external hemorrhoid, nontender digital rectal exam Msk:  Symmetrical without gross deformities. Good, equal movement & strength bilaterally. Pulses:  Normal pulses noted. Extremities:  No clubbing or edema.  No cyanosis. Neurologic:  Alert and oriented x3;  grossly normal neurologically. Skin:  Intact without significant lesions or rashes. No jaundice. Psych:  Alert and cooperative. Normal mood and affect.  Imaging Studies: No abdominal imaging  Assessment and Plan:  ZAVIAH NASTRI is a 58 y.o. female with history of hypothyroidism, diabetes seen in consultation for symptomatic, thrombosed external hemorrhoid which is currently resolving.  Discussed with her about outpatient hemorrhoid ligation of the left lateral hemorrhoid including risks and benefits.  Patient is willing to undergo hemorrhoid ligation today, consent obtained.  Follow up as needed   Angela Darby, MD

## 2019-04-17 NOTE — Progress Notes (Signed)
PROCEDURE NOTE: The patient presents with symptomatic grade 3 hemorrhoids, unresponsive to maximal medical therapy, requesting rubber band ligation of his/her hemorrhoidal disease.  All risks, benefits and alternative forms of therapy were described and informed consent was obtained.  The decision was made to band the LL internal hemorrhoid, and the [facility] System was used to perform band ligation without complication.  Digital anorectal examination was then performed to assure proper positioning of the band, and to adjust the banded tissue as required.  The patient was discharged home without pain or other issues.  Dietary and behavioral recommendations were given and (if necessary - prescriptions were given), along with follow-up instructions.  The patient will return as needed for follow-up and possible additional banding as required.  No complications were encountered and the patient tolerated the procedure well.  

## 2019-06-29 ENCOUNTER — Other Ambulatory Visit: Payer: Self-pay | Admitting: Family Medicine

## 2019-06-29 DIAGNOSIS — Z1231 Encounter for screening mammogram for malignant neoplasm of breast: Secondary | ICD-10-CM

## 2019-06-29 DIAGNOSIS — M8580 Other specified disorders of bone density and structure, unspecified site: Secondary | ICD-10-CM

## 2019-11-02 ENCOUNTER — Ambulatory Visit
Admission: RE | Admit: 2019-11-02 | Discharge: 2019-11-02 | Disposition: A | Discharge status: Home / Self Care | Payer: No Typology Code available for payment source | Source: Ambulatory Visit | Attending: Family Medicine | Admitting: Family Medicine

## 2019-11-02 DIAGNOSIS — M8580 Other specified disorders of bone density and structure, unspecified site: Secondary | ICD-10-CM

## 2019-11-02 DIAGNOSIS — Z1231 Encounter for screening mammogram for malignant neoplasm of breast: Secondary | ICD-10-CM | POA: Diagnosis present

## 2020-07-09 ENCOUNTER — Other Ambulatory Visit: Payer: Self-pay | Admitting: Family Medicine

## 2020-07-10 ENCOUNTER — Other Ambulatory Visit: Payer: Self-pay | Admitting: Family Medicine

## 2020-08-01 ENCOUNTER — Other Ambulatory Visit: Payer: Self-pay | Admitting: Otolaryngology

## 2020-08-26 ENCOUNTER — Other Ambulatory Visit: Payer: Self-pay | Admitting: Internal Medicine

## 2020-09-09 ENCOUNTER — Other Ambulatory Visit: Payer: Self-pay

## 2020-09-09 MED FILL — Venlafaxine HCl Cap ER 24HR 150 MG (Base Equivalent): ORAL | 90 days supply | Qty: 90 | Fill #0 | Status: AC

## 2020-09-09 MED FILL — Levothyroxine Sodium Tab 125 MCG: ORAL | 90 days supply | Qty: 90 | Fill #0 | Status: AC

## 2020-09-09 MED FILL — Atorvastatin Calcium Tab 20 MG (Base Equivalent): ORAL | 90 days supply | Qty: 90 | Fill #0 | Status: AC

## 2020-09-09 MED FILL — Metformin HCl Tab ER 24HR 500 MG: ORAL | 90 days supply | Qty: 360 | Fill #0 | Status: AC

## 2020-09-10 ENCOUNTER — Other Ambulatory Visit: Payer: Self-pay

## 2020-10-08 ENCOUNTER — Other Ambulatory Visit: Payer: Self-pay

## 2020-10-08 MED ORDER — DULAGLUTIDE 1.5 MG/0.5ML ~~LOC~~ SOAJ
SUBCUTANEOUS | 3 refills | Status: DC
  Filled 2020-10-08: qty 6, 84d supply, fill #0

## 2020-10-08 MED FILL — Dulaglutide Soln Auto-injector 1.5 MG/0.5ML: SUBCUTANEOUS | 28 days supply | Qty: 2 | Fill #0 | Status: CN

## 2020-10-16 ENCOUNTER — Other Ambulatory Visit: Payer: Self-pay

## 2020-11-11 ENCOUNTER — Other Ambulatory Visit: Payer: Self-pay | Admitting: Family Medicine

## 2020-11-11 DIAGNOSIS — Z1231 Encounter for screening mammogram for malignant neoplasm of breast: Secondary | ICD-10-CM

## 2020-11-15 ENCOUNTER — Ambulatory Visit
Admission: RE | Admit: 2020-11-15 | Discharge: 2020-11-15 | Disposition: A | Discharge status: Home / Self Care | Payer: No Typology Code available for payment source | Source: Ambulatory Visit | Attending: Family Medicine | Admitting: Family Medicine

## 2020-11-15 ENCOUNTER — Other Ambulatory Visit: Payer: Self-pay

## 2020-11-15 DIAGNOSIS — Z1231 Encounter for screening mammogram for malignant neoplasm of breast: Secondary | ICD-10-CM | POA: Diagnosis present

## 2020-11-28 ENCOUNTER — Other Ambulatory Visit: Payer: Self-pay

## 2020-11-28 MED ORDER — PAXLOVID 20 X 150 MG & 10 X 100MG PO TBPK
ORAL_TABLET | ORAL | 0 refills | Status: AC
  Filled 2020-11-28: qty 30, 5d supply, fill #0

## 2020-11-28 MED ORDER — GUAIFENESIN-CODEINE 100-10 MG/5ML PO SOLN
ORAL | 0 refills | Status: AC
  Filled 2020-11-28: qty 118, 6d supply, fill #0

## 2020-12-06 ENCOUNTER — Other Ambulatory Visit (HOSPITAL_COMMUNITY): Payer: Self-pay

## 2020-12-16 MED FILL — Metformin HCl Tab ER 24HR 500 MG: ORAL | 90 days supply | Qty: 360 | Fill #1 | Status: AC

## 2020-12-17 ENCOUNTER — Other Ambulatory Visit: Payer: Self-pay

## 2020-12-17 MED FILL — Atorvastatin Calcium Tab 20 MG (Base Equivalent): ORAL | 90 days supply | Qty: 90 | Fill #1 | Status: AC

## 2020-12-17 MED FILL — Levothyroxine Sodium Tab 125 MCG: ORAL | 90 days supply | Qty: 90 | Fill #1 | Status: AC

## 2020-12-17 MED FILL — Venlafaxine HCl Cap ER 24HR 150 MG (Base Equivalent): ORAL | 90 days supply | Qty: 90 | Fill #1 | Status: AC

## 2021-01-08 ENCOUNTER — Other Ambulatory Visit: Payer: Self-pay

## 2021-01-30 ENCOUNTER — Other Ambulatory Visit: Payer: Self-pay

## 2021-01-30 MED ORDER — DULAGLUTIDE 1.5 MG/0.5ML ~~LOC~~ SOAJ
SUBCUTANEOUS | 2 refills | Status: DC
  Filled 2021-01-30: qty 6, 84d supply, fill #0

## 2021-03-19 ENCOUNTER — Other Ambulatory Visit: Payer: Self-pay

## 2021-03-19 MED FILL — Levothyroxine Sodium Tab 125 MCG: ORAL | 90 days supply | Qty: 90 | Fill #2 | Status: AC

## 2021-03-19 MED FILL — Atorvastatin Calcium Tab 20 MG (Base Equivalent): ORAL | 90 days supply | Qty: 90 | Fill #2 | Status: AC

## 2021-03-19 MED FILL — Venlafaxine HCl Cap ER 24HR 150 MG (Base Equivalent): ORAL | 90 days supply | Qty: 90 | Fill #2 | Status: AC

## 2021-03-19 MED FILL — Metformin HCl Tab ER 24HR 500 MG: ORAL | 90 days supply | Qty: 360 | Fill #2 | Status: AC

## 2021-05-05 ENCOUNTER — Other Ambulatory Visit: Payer: Self-pay

## 2021-05-05 MED ORDER — DULAGLUTIDE 1.5 MG/0.5ML ~~LOC~~ SOAJ
SUBCUTANEOUS | 2 refills | Status: DC
  Filled 2021-05-05: qty 6, 84d supply, fill #0

## 2021-06-17 MED FILL — Levothyroxine Sodium Tab 125 MCG: ORAL | 90 days supply | Qty: 90 | Fill #3 | Status: AC

## 2021-06-17 MED FILL — Metformin HCl Tab ER 24HR 500 MG: ORAL | 90 days supply | Qty: 360 | Fill #3 | Status: AC

## 2021-06-17 MED FILL — Atorvastatin Calcium Tab 20 MG (Base Equivalent): ORAL | 90 days supply | Qty: 90 | Fill #3 | Status: AC

## 2021-06-17 MED FILL — Venlafaxine HCl Cap ER 24HR 150 MG (Base Equivalent): ORAL | 90 days supply | Qty: 90 | Fill #3 | Status: AC

## 2021-06-18 ENCOUNTER — Other Ambulatory Visit: Payer: Self-pay

## 2021-07-03 ENCOUNTER — Other Ambulatory Visit: Payer: Self-pay

## 2021-07-31 ENCOUNTER — Other Ambulatory Visit: Payer: Self-pay

## 2021-08-02 ENCOUNTER — Other Ambulatory Visit: Payer: Self-pay

## 2021-08-04 ENCOUNTER — Other Ambulatory Visit: Payer: Self-pay

## 2021-08-04 MED ORDER — DULAGLUTIDE 1.5 MG/0.5ML ~~LOC~~ SOAJ
SUBCUTANEOUS | 2 refills | Status: DC
  Filled 2021-08-04: qty 6, 84d supply, fill #0

## 2021-09-17 ENCOUNTER — Other Ambulatory Visit: Payer: Self-pay

## 2021-09-17 MED ORDER — LEVOTHYROXINE SODIUM 125 MCG PO TABS
ORAL_TABLET | Freq: Every day | ORAL | 3 refills | Status: DC
  Filled 2021-09-17: qty 90, 90d supply, fill #0
  Filled 2021-12-19: qty 90, 90d supply, fill #1
  Filled 2022-03-26: qty 90, 90d supply, fill #2
  Filled 2022-06-20: qty 90, 90d supply, fill #3

## 2021-09-17 MED ORDER — METFORMIN HCL ER 500 MG PO TB24
ORAL_TABLET | Freq: Two times a day (BID) | ORAL | 3 refills | Status: DC
  Filled 2021-09-17: qty 360, 90d supply, fill #0
  Filled 2021-12-19: qty 360, 90d supply, fill #1
  Filled 2022-03-26: qty 360, 90d supply, fill #2
  Filled 2022-06-20: qty 360, 90d supply, fill #3

## 2021-09-17 MED ORDER — ATORVASTATIN CALCIUM 20 MG PO TABS
ORAL_TABLET | Freq: Every day | ORAL | 3 refills | Status: DC
  Filled 2021-09-17: qty 90, 90d supply, fill #0
  Filled 2021-12-19: qty 90, 90d supply, fill #1
  Filled 2022-03-26: qty 90, 90d supply, fill #2
  Filled 2022-06-20: qty 90, 90d supply, fill #3

## 2021-09-17 MED ORDER — VENLAFAXINE HCL ER 150 MG PO CP24
ORAL_CAPSULE | Freq: Every day | ORAL | 3 refills | Status: DC
  Filled 2021-09-17: qty 90, 90d supply, fill #0
  Filled 2021-12-19: qty 90, 90d supply, fill #1
  Filled 2022-03-26: qty 90, 90d supply, fill #2
  Filled 2022-06-20: qty 90, 90d supply, fill #3

## 2021-10-15 ENCOUNTER — Other Ambulatory Visit: Payer: Self-pay | Admitting: Family Medicine

## 2021-10-15 DIAGNOSIS — Z1231 Encounter for screening mammogram for malignant neoplasm of breast: Secondary | ICD-10-CM

## 2021-10-27 ENCOUNTER — Other Ambulatory Visit: Payer: Self-pay

## 2021-10-28 ENCOUNTER — Other Ambulatory Visit: Payer: Self-pay

## 2021-10-28 MED ORDER — DULAGLUTIDE 1.5 MG/0.5ML ~~LOC~~ SOAJ
SUBCUTANEOUS | 2 refills | Status: DC
  Filled 2021-10-28: qty 6, 84d supply, fill #0

## 2021-11-17 ENCOUNTER — Ambulatory Visit
Admission: RE | Admit: 2021-11-17 | Discharge: 2021-11-17 | Disposition: A | Discharge status: Home / Self Care | Payer: No Typology Code available for payment source | Source: Ambulatory Visit | Attending: Family Medicine | Admitting: Family Medicine

## 2021-11-17 DIAGNOSIS — Z1231 Encounter for screening mammogram for malignant neoplasm of breast: Secondary | ICD-10-CM | POA: Diagnosis present

## 2021-12-19 ENCOUNTER — Other Ambulatory Visit: Payer: Self-pay

## 2021-12-22 ENCOUNTER — Other Ambulatory Visit: Payer: Self-pay

## 2022-01-19 ENCOUNTER — Other Ambulatory Visit: Payer: Self-pay

## 2022-01-20 ENCOUNTER — Other Ambulatory Visit: Payer: Self-pay

## 2022-01-20 MED ORDER — DULAGLUTIDE 1.5 MG/0.5ML ~~LOC~~ SOAJ
SUBCUTANEOUS | 2 refills | Status: DC
  Filled 2022-01-20: qty 6, 84d supply, fill #0

## 2022-03-26 ENCOUNTER — Other Ambulatory Visit: Payer: Self-pay

## 2022-03-26 MED ORDER — DULAGLUTIDE 1.5 MG/0.5ML ~~LOC~~ SOAJ
SUBCUTANEOUS | 2 refills | Status: AC
  Filled 2022-03-26: qty 2, 28d supply, fill #0
  Filled 2022-04-08: qty 6, 84d supply, fill #0

## 2022-04-08 ENCOUNTER — Other Ambulatory Visit: Payer: Self-pay

## 2022-07-10 DIAGNOSIS — E785 Hyperlipidemia, unspecified: Secondary | ICD-10-CM | POA: Diagnosis not present

## 2022-07-10 DIAGNOSIS — M8580 Other specified disorders of bone density and structure, unspecified site: Secondary | ICD-10-CM | POA: Diagnosis not present

## 2022-07-10 DIAGNOSIS — E119 Type 2 diabetes mellitus without complications: Secondary | ICD-10-CM | POA: Diagnosis not present

## 2022-07-10 DIAGNOSIS — E039 Hypothyroidism, unspecified: Secondary | ICD-10-CM | POA: Diagnosis not present

## 2022-07-16 DIAGNOSIS — E119 Type 2 diabetes mellitus without complications: Secondary | ICD-10-CM | POA: Diagnosis not present

## 2022-07-17 ENCOUNTER — Other Ambulatory Visit: Payer: Self-pay

## 2022-07-17 DIAGNOSIS — E119 Type 2 diabetes mellitus without complications: Secondary | ICD-10-CM | POA: Diagnosis not present

## 2022-07-17 DIAGNOSIS — F419 Anxiety disorder, unspecified: Secondary | ICD-10-CM | POA: Diagnosis not present

## 2022-07-17 DIAGNOSIS — Z0000 Encounter for general adult medical examination without abnormal findings: Secondary | ICD-10-CM | POA: Diagnosis not present

## 2022-07-17 DIAGNOSIS — E039 Hypothyroidism, unspecified: Secondary | ICD-10-CM | POA: Diagnosis not present

## 2022-07-17 DIAGNOSIS — E785 Hyperlipidemia, unspecified: Secondary | ICD-10-CM | POA: Diagnosis not present

## 2022-07-17 DIAGNOSIS — M8580 Other specified disorders of bone density and structure, unspecified site: Secondary | ICD-10-CM | POA: Diagnosis not present

## 2022-07-17 MED ORDER — ATORVASTATIN CALCIUM 20 MG PO TABS
20.0000 mg | ORAL_TABLET | Freq: Every day | ORAL | 3 refills | Status: DC
  Filled 2022-07-17 – 2022-10-06 (×2): qty 90, 90d supply, fill #0
  Filled 2022-12-24: qty 90, 90d supply, fill #1
  Filled 2023-03-24: qty 90, 90d supply, fill #2
  Filled 2023-04-18: qty 90, 90d supply, fill #3

## 2022-07-17 MED ORDER — METFORMIN HCL ER 500 MG PO TB24
1000.0000 mg | ORAL_TABLET | Freq: Two times a day (BID) | ORAL | 3 refills | Status: DC
  Filled 2022-07-17 – 2022-09-28 (×2): qty 360, 90d supply, fill #0
  Filled 2022-12-24: qty 360, 90d supply, fill #1
  Filled 2023-03-24: qty 360, 90d supply, fill #2
  Filled 2023-04-18 – 2023-06-28 (×2): qty 360, 90d supply, fill #3

## 2022-07-17 MED ORDER — VENLAFAXINE HCL ER 150 MG PO CP24
150.0000 mg | ORAL_CAPSULE | Freq: Every day | ORAL | 3 refills | Status: AC
  Filled 2022-07-17: qty 90, 90d supply, fill #0

## 2022-07-17 MED ORDER — FREESTYLE LITE TEST VI STRP
ORAL_STRIP | 3 refills | Status: AC
  Filled 2022-07-17: qty 100, 90d supply, fill #0

## 2022-07-17 MED ORDER — TRULICITY 1.5 MG/0.5ML ~~LOC~~ SOAJ
SUBCUTANEOUS | 3 refills | Status: DC
  Filled 2022-07-17: qty 2, 28d supply, fill #0
  Filled 2022-08-10: qty 2, 28d supply, fill #1
  Filled 2022-09-08: qty 2, 28d supply, fill #2
  Filled 2022-10-06: qty 2, 28d supply, fill #3
  Filled 2022-11-02: qty 2, 28d supply, fill #4
  Filled 2022-11-30: qty 2, 28d supply, fill #5
  Filled 2022-12-31: qty 2, 28d supply, fill #6
  Filled 2023-01-25: qty 2, 28d supply, fill #7
  Filled 2023-02-22: qty 2, 28d supply, fill #8
  Filled 2023-03-12 – 2023-03-17 (×2): qty 2, 28d supply, fill #9
  Filled 2023-04-18: qty 2, 28d supply, fill #10
  Filled 2023-05-17: qty 2, 28d supply, fill #11

## 2022-07-17 MED ORDER — OFLOXACIN 0.3 % OT SOLN
5.0000 [drp] | Freq: Two times a day (BID) | OTIC | 1 refills | Status: AC | PRN
  Filled 2022-07-17: qty 10, 20d supply, fill #0

## 2022-07-17 MED ORDER — LEVOTHYROXINE SODIUM 125 MCG PO TABS
125.0000 ug | ORAL_TABLET | Freq: Every day | ORAL | 1 refills | Status: DC
  Filled 2022-07-17 – 2022-10-06 (×2): qty 90, 90d supply, fill #0
  Filled 2022-12-24: qty 90, 90d supply, fill #1

## 2022-07-20 ENCOUNTER — Other Ambulatory Visit: Payer: Self-pay | Admitting: Family Medicine

## 2022-07-20 DIAGNOSIS — Z1231 Encounter for screening mammogram for malignant neoplasm of breast: Secondary | ICD-10-CM

## 2022-07-20 DIAGNOSIS — M8580 Other specified disorders of bone density and structure, unspecified site: Secondary | ICD-10-CM

## 2022-08-12 ENCOUNTER — Other Ambulatory Visit: Payer: Self-pay

## 2022-08-12 DIAGNOSIS — R42 Dizziness and giddiness: Secondary | ICD-10-CM | POA: Diagnosis not present

## 2022-08-12 MED ORDER — VENLAFAXINE HCL ER 75 MG PO CP24
75.0000 mg | ORAL_CAPSULE | Freq: Every day | ORAL | 0 refills | Status: DC
  Filled 2022-08-12: qty 90, 90d supply, fill #0

## 2022-08-13 ENCOUNTER — Other Ambulatory Visit: Payer: Self-pay

## 2022-08-13 MED ORDER — LEVOTHYROXINE SODIUM 112 MCG PO TABS
ORAL_TABLET | ORAL | 0 refills | Status: DC
  Filled 2022-08-13: qty 90, 90d supply, fill #0

## 2022-08-17 ENCOUNTER — Other Ambulatory Visit: Payer: Self-pay

## 2022-08-24 DIAGNOSIS — E119 Type 2 diabetes mellitus without complications: Secondary | ICD-10-CM | POA: Diagnosis not present

## 2022-09-08 ENCOUNTER — Other Ambulatory Visit: Payer: Self-pay

## 2022-09-28 ENCOUNTER — Other Ambulatory Visit: Payer: Self-pay

## 2022-10-06 ENCOUNTER — Other Ambulatory Visit: Payer: Self-pay

## 2022-11-04 ENCOUNTER — Other Ambulatory Visit: Payer: Self-pay

## 2022-11-04 DIAGNOSIS — J069 Acute upper respiratory infection, unspecified: Secondary | ICD-10-CM | POA: Diagnosis not present

## 2022-11-04 MED ORDER — VENLAFAXINE HCL ER 75 MG PO CP24
75.0000 mg | ORAL_CAPSULE | Freq: Every day | ORAL | 3 refills | Status: DC
  Filled 2022-11-04: qty 90, 90d supply, fill #0
  Filled 2023-02-10: qty 90, 90d supply, fill #1
  Filled 2023-03-24 – 2023-04-18 (×2): qty 90, 90d supply, fill #2
  Filled 2023-07-22: qty 90, 90d supply, fill #3

## 2022-11-04 MED ORDER — FLUTICASONE PROPIONATE 50 MCG/ACT NA SUSP
2.0000 | Freq: Every day | NASAL | 0 refills | Status: DC
  Filled 2022-11-04: qty 16, 30d supply, fill #0

## 2022-11-04 MED ORDER — AZITHROMYCIN 250 MG PO TABS
ORAL_TABLET | Freq: Every day | ORAL | 0 refills | Status: AC
  Filled 2022-11-04: qty 6, 5d supply, fill #0

## 2022-11-19 ENCOUNTER — Other Ambulatory Visit: Payer: 59

## 2023-01-11 DIAGNOSIS — E119 Type 2 diabetes mellitus without complications: Secondary | ICD-10-CM | POA: Diagnosis not present

## 2023-01-11 DIAGNOSIS — M8580 Other specified disorders of bone density and structure, unspecified site: Secondary | ICD-10-CM | POA: Diagnosis not present

## 2023-01-11 DIAGNOSIS — E039 Hypothyroidism, unspecified: Secondary | ICD-10-CM | POA: Diagnosis not present

## 2023-01-13 ENCOUNTER — Other Ambulatory Visit: Payer: Self-pay

## 2023-01-13 MED ORDER — COMIRNATY 30 MCG/0.3ML IM SUSY
0.3000 mL | PREFILLED_SYRINGE | Freq: Once | INTRAMUSCULAR | 0 refills | Status: AC
  Filled 2023-01-14: qty 0.3, 1d supply, fill #0

## 2023-01-14 ENCOUNTER — Other Ambulatory Visit: Payer: Self-pay

## 2023-01-18 ENCOUNTER — Other Ambulatory Visit: Payer: Self-pay

## 2023-01-18 ENCOUNTER — Ambulatory Visit
Admission: RE | Admit: 2023-01-18 | Discharge: 2023-01-18 | Disposition: A | Discharge status: Home / Self Care | Payer: 59 | Source: Ambulatory Visit | Attending: Family Medicine | Admitting: Family Medicine

## 2023-01-18 DIAGNOSIS — M8580 Other specified disorders of bone density and structure, unspecified site: Secondary | ICD-10-CM | POA: Insufficient documentation

## 2023-01-18 DIAGNOSIS — E039 Hypothyroidism, unspecified: Secondary | ICD-10-CM | POA: Diagnosis not present

## 2023-01-18 DIAGNOSIS — Z1231 Encounter for screening mammogram for malignant neoplasm of breast: Secondary | ICD-10-CM | POA: Diagnosis not present

## 2023-01-18 DIAGNOSIS — E785 Hyperlipidemia, unspecified: Secondary | ICD-10-CM | POA: Diagnosis not present

## 2023-01-18 DIAGNOSIS — F419 Anxiety disorder, unspecified: Secondary | ICD-10-CM | POA: Diagnosis not present

## 2023-01-18 DIAGNOSIS — D649 Anemia, unspecified: Secondary | ICD-10-CM | POA: Diagnosis not present

## 2023-01-18 DIAGNOSIS — E119 Type 2 diabetes mellitus without complications: Secondary | ICD-10-CM | POA: Diagnosis not present

## 2023-01-18 MED ORDER — LEVOTHYROXINE SODIUM 100 MCG PO TABS
100.0000 ug | ORAL_TABLET | ORAL | 0 refills | Status: DC
  Filled 2023-01-18: qty 60, 60d supply, fill #0

## 2023-01-31 ENCOUNTER — Other Ambulatory Visit: Payer: Self-pay

## 2023-02-08 ENCOUNTER — Other Ambulatory Visit: Payer: Self-pay

## 2023-02-08 MED ORDER — ALENDRONATE SODIUM 70 MG PO TABS
70.0000 mg | ORAL_TABLET | ORAL | 11 refills | Status: AC
  Filled 2023-02-08 – 2023-04-18 (×2): qty 4, 28d supply, fill #0

## 2023-02-10 ENCOUNTER — Other Ambulatory Visit: Payer: Self-pay

## 2023-03-01 ENCOUNTER — Other Ambulatory Visit: Payer: Self-pay

## 2023-03-01 ENCOUNTER — Encounter (HOSPITAL_COMMUNITY): Payer: Self-pay

## 2023-03-01 MED ORDER — PROLIA 60 MG/ML ~~LOC~~ SOSY
PREFILLED_SYRINGE | SUBCUTANEOUS | 1 refills | Status: AC
Start: 2023-03-01 — End: ?
  Filled 2023-03-01: qty 1, 90d supply, fill #0

## 2023-03-01 NOTE — Progress Notes (Signed)
Pharmacy Patient Advocate Encounter   Received notification from Patient Pharmacy that prior authorization for Prolia is required/requested.   Insurance verification completed.   The patient is insured through [facility] .   Per test claim: PA required; PA submitted to [facility] via CoverMyMeds Key/confirmation #/EOC [id number] Status is pending

## 2023-03-03 NOTE — Progress Notes (Signed)
Pharmacy Patient Advocate Encounter  Received notification from [facility] that Prior Authorization for Prolia has been APPROVED from 03/01/23 to 02/29/24   PA #/Case ID/Reference #: [id number]

## 2023-03-08 DIAGNOSIS — E039 Hypothyroidism, unspecified: Secondary | ICD-10-CM | POA: Diagnosis not present

## 2023-03-08 DIAGNOSIS — E119 Type 2 diabetes mellitus without complications: Secondary | ICD-10-CM | POA: Diagnosis not present

## 2023-03-12 ENCOUNTER — Other Ambulatory Visit: Payer: Self-pay

## 2023-03-12 MED ORDER — LEVOTHYROXINE SODIUM 100 MCG PO TABS
100.0000 ug | ORAL_TABLET | ORAL | 0 refills | Status: DC
  Filled 2023-03-12: qty 60, 60d supply, fill #0

## 2023-03-24 ENCOUNTER — Other Ambulatory Visit: Payer: Self-pay

## 2023-04-15 DIAGNOSIS — D649 Anemia, unspecified: Secondary | ICD-10-CM | POA: Diagnosis not present

## 2023-04-18 ENCOUNTER — Other Ambulatory Visit: Payer: Self-pay

## 2023-04-19 ENCOUNTER — Other Ambulatory Visit: Payer: Self-pay

## 2023-04-19 MED ORDER — FLUTICASONE PROPIONATE 50 MCG/ACT NA SUSP
2.0000 | Freq: Every day | NASAL | 0 refills | Status: AC
  Filled 2023-04-19: qty 16, 30d supply, fill #0

## 2023-04-19 MED ORDER — LEVOTHYROXINE SODIUM 125 MCG PO TABS
125.0000 ug | ORAL_TABLET | Freq: Every day | ORAL | 1 refills | Status: DC
  Filled 2023-04-19: qty 90, 90d supply, fill #0

## 2023-04-21 ENCOUNTER — Other Ambulatory Visit: Payer: Self-pay

## 2023-04-22 ENCOUNTER — Other Ambulatory Visit: Payer: Self-pay

## 2023-04-26 ENCOUNTER — Other Ambulatory Visit: Payer: Self-pay

## 2023-05-17 ENCOUNTER — Other Ambulatory Visit: Payer: Self-pay

## 2023-05-17 MED ORDER — LEVOTHYROXINE SODIUM 100 MCG PO TABS
100.0000 ug | ORAL_TABLET | Freq: Every day | ORAL | 0 refills | Status: DC
  Filled 2023-05-17: qty 60, 60d supply, fill #0

## 2023-06-14 ENCOUNTER — Other Ambulatory Visit: Payer: Self-pay

## 2023-06-14 MED ORDER — DULAGLUTIDE 1.5 MG/0.5ML ~~LOC~~ SOAJ
SUBCUTANEOUS | 3 refills | Status: DC
  Filled 2023-06-14: qty 6, 84d supply, fill #0
  Filled 2023-09-07 – 2023-09-16 (×4): qty 6, 84d supply, fill #1
  Filled 2023-09-16: qty 2, 28d supply, fill #1
  Filled 2023-09-16 – 2023-09-17 (×2): qty 6, 84d supply, fill #1
  Filled 2023-11-22: qty 6, 84d supply, fill #2
  Filled 2024-02-27: qty 6, 84d supply, fill #3

## 2023-06-15 ENCOUNTER — Other Ambulatory Visit: Payer: Self-pay

## 2023-07-06 ENCOUNTER — Other Ambulatory Visit: Payer: Self-pay

## 2023-07-06 NOTE — Progress Notes (Signed)
Patient not getting at this time. She is going to talk to her provider about alternative options. She will call if she changes her mind. Dis-enrolling

## 2023-07-06 NOTE — Progress Notes (Signed)
 Pharmacy Patient Advocate Encounter  Insurance verification completed.   The patient is insured through [employer] test claim for Prolia. Currently a quantity of 1ml is a 180 day supply and the co-pay is $297.   This test claim was processed through [facility]- copay amounts may vary at other pharmacies due to pharmacy/plan contracts, or as the patient moves through the different stages of their insurance plan.

## 2023-07-21 ENCOUNTER — Other Ambulatory Visit: Payer: Self-pay

## 2023-07-21 MED ORDER — IBANDRONATE SODIUM 150 MG PO TABS
150.0000 mg | ORAL_TABLET | ORAL | 11 refills | Status: AC
  Filled 2023-07-21: qty 1, 30d supply, fill #0
  Filled 2023-08-27: qty 3, 90d supply, fill #1
  Filled 2023-11-22: qty 3, 90d supply, fill #2
  Filled 2024-02-27: qty 3, 90d supply, fill #3
  Filled 2024-06-04: qty 2, 60d supply, fill #4

## 2023-07-22 ENCOUNTER — Other Ambulatory Visit: Payer: Self-pay

## 2023-07-23 ENCOUNTER — Other Ambulatory Visit: Payer: Self-pay

## 2023-07-23 MED ORDER — LEVOTHYROXINE SODIUM 100 MCG PO TABS
100.0000 ug | ORAL_TABLET | Freq: Every day | ORAL | 1 refills | Status: AC
  Filled 2023-07-23: qty 90, 90d supply, fill #0
  Filled 2023-10-15: qty 90, 90d supply, fill #1
  Filled 2024-01-16: qty 90, 90d supply, fill #2
  Filled 2024-04-10: qty 90, 90d supply, fill #3

## 2023-07-23 MED ORDER — ATORVASTATIN CALCIUM 20 MG PO TABS
20.0000 mg | ORAL_TABLET | Freq: Every day | ORAL | 1 refills | Status: DC
  Filled 2023-07-23: qty 90, 90d supply, fill #0
  Filled 2023-10-15: qty 90, 90d supply, fill #1

## 2023-07-23 MED ORDER — METFORMIN HCL ER 500 MG PO TB24
1000.0000 mg | ORAL_TABLET | Freq: Two times a day (BID) | ORAL | 1 refills | Status: DC
  Filled 2023-07-23: qty 360, 90d supply, fill #0
  Filled 2023-10-15: qty 360, 90d supply, fill #1

## 2023-08-27 ENCOUNTER — Other Ambulatory Visit: Payer: Self-pay

## 2023-09-03 ENCOUNTER — Other Ambulatory Visit (HOSPITAL_COMMUNITY): Payer: Self-pay

## 2023-09-07 ENCOUNTER — Other Ambulatory Visit: Payer: Self-pay

## 2023-09-13 ENCOUNTER — Other Ambulatory Visit (HOSPITAL_COMMUNITY): Payer: Self-pay

## 2023-09-13 ENCOUNTER — Other Ambulatory Visit: Payer: Self-pay

## 2023-09-16 ENCOUNTER — Other Ambulatory Visit: Payer: Self-pay

## 2023-09-17 ENCOUNTER — Other Ambulatory Visit: Payer: Self-pay

## 2023-10-15 ENCOUNTER — Other Ambulatory Visit: Payer: Self-pay

## 2023-10-15 MED ORDER — VENLAFAXINE HCL ER 75 MG PO CP24
75.0000 mg | ORAL_CAPSULE | Freq: Every day | ORAL | 0 refills | Status: DC
  Filled 2023-10-15: qty 90, 90d supply, fill #0

## 2023-10-26 ENCOUNTER — Other Ambulatory Visit: Payer: Self-pay

## 2023-11-22 ENCOUNTER — Other Ambulatory Visit: Payer: Self-pay

## 2023-12-17 ENCOUNTER — Other Ambulatory Visit: Payer: Self-pay | Admitting: Family Medicine

## 2023-12-17 DIAGNOSIS — Z1231 Encounter for screening mammogram for malignant neoplasm of breast: Secondary | ICD-10-CM

## 2024-01-19 ENCOUNTER — Ambulatory Visit
Admission: RE | Admit: 2024-01-19 | Discharge: 2024-01-19 | Disposition: A | Discharge status: Home / Self Care | Source: Ambulatory Visit | Attending: Family Medicine | Admitting: Family Medicine

## 2024-01-19 ENCOUNTER — Other Ambulatory Visit: Payer: Self-pay

## 2024-01-19 DIAGNOSIS — Z1231 Encounter for screening mammogram for malignant neoplasm of breast: Secondary | ICD-10-CM | POA: Insufficient documentation

## 2024-01-25 ENCOUNTER — Other Ambulatory Visit: Payer: Self-pay

## 2024-01-25 MED ORDER — METFORMIN HCL ER 500 MG PO TB24
1000.0000 mg | ORAL_TABLET | Freq: Two times a day (BID) | ORAL | 1 refills | Status: AC
  Filled 2024-01-25: qty 360, 90d supply, fill #0
  Filled 2024-04-24: qty 360, 90d supply, fill #1

## 2024-01-25 MED ORDER — VENLAFAXINE HCL ER 75 MG PO CP24
75.0000 mg | ORAL_CAPSULE | Freq: Every day | ORAL | 0 refills | Status: DC
  Filled 2024-01-25: qty 90, 90d supply, fill #0

## 2024-01-25 MED ORDER — ATORVASTATIN CALCIUM 20 MG PO TABS
20.0000 mg | ORAL_TABLET | Freq: Every day | ORAL | 1 refills | Status: AC
  Filled 2024-01-25: qty 90, 90d supply, fill #0
  Filled 2024-04-24: qty 90, 90d supply, fill #1

## 2024-04-24 ENCOUNTER — Other Ambulatory Visit: Payer: Self-pay

## 2024-04-24 MED ORDER — VENLAFAXINE HCL ER 75 MG PO CP24
75.0000 mg | ORAL_CAPSULE | Freq: Every day | ORAL | 0 refills | Status: AC
  Filled 2024-04-24: qty 90, 90d supply, fill #0

## 2024-05-31 ENCOUNTER — Other Ambulatory Visit: Payer: Self-pay

## 2024-05-31 MED ORDER — DULAGLUTIDE 1.5 MG/0.5ML ~~LOC~~ SOAJ
SUBCUTANEOUS | 0 refills | Status: AC
  Filled 2024-05-31: qty 6, 84d supply, fill #0

## 2024-06-04 ENCOUNTER — Other Ambulatory Visit: Payer: Self-pay
